# Patient Record
Sex: Male | Born: 1995 | Race: Black or African American | Hispanic: No | Marital: Married | Smoking: Former smoker
Health system: Southern US, Community
[De-identification: ages and names within clinical notes are randomized; demographics above are authoritative.]

---
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
## 2015-04-12 ENCOUNTER — Emergency Department (HOSPITAL_COMMUNITY): Payer: 59

## 2015-04-12 ENCOUNTER — Emergency Department (HOSPITAL_COMMUNITY)
Admission: EM | Admit: 2015-04-12 | Discharge: 2015-04-12 | Disposition: A | Discharge status: Home / Self Care | Payer: 59 | Attending: Emergency Medicine | Admitting: Emergency Medicine

## 2015-04-12 ENCOUNTER — Encounter (HOSPITAL_COMMUNITY): Payer: Self-pay | Admitting: *Deleted

## 2015-04-12 DIAGNOSIS — Z791 Long term (current) use of non-steroidal anti-inflammatories (NSAID): Secondary | ICD-10-CM | POA: Insufficient documentation

## 2015-04-12 DIAGNOSIS — Y92410 Unspecified street and highway as the place of occurrence of the external cause: Secondary | ICD-10-CM | POA: Diagnosis not present

## 2015-04-12 DIAGNOSIS — S8991XA Unspecified injury of right lower leg, initial encounter: Secondary | ICD-10-CM | POA: Insufficient documentation

## 2015-04-12 DIAGNOSIS — Y999 Unspecified external cause status: Secondary | ICD-10-CM | POA: Insufficient documentation

## 2015-04-12 DIAGNOSIS — Z87891 Personal history of nicotine dependence: Secondary | ICD-10-CM | POA: Diagnosis not present

## 2015-04-12 DIAGNOSIS — Y9389 Activity, other specified: Secondary | ICD-10-CM | POA: Diagnosis not present

## 2015-04-12 DIAGNOSIS — Z79899 Other long term (current) drug therapy: Secondary | ICD-10-CM | POA: Insufficient documentation

## 2015-04-12 MED ORDER — TRAMADOL HCL 50 MG PO TABS
50.0000 mg | ORAL_TABLET | Freq: Once | ORAL | Status: AC
  Administered 2015-04-12: 50 mg via ORAL
  Filled 2015-04-12: qty 1

## 2015-04-12 MED ORDER — IBUPROFEN 800 MG PO TABS
800.0000 mg | ORAL_TABLET | Freq: Once | ORAL | Status: AC
  Administered 2015-04-12: 800 mg via ORAL
  Filled 2015-04-12: qty 1

## 2015-04-12 NOTE — ED Notes (Signed)
Pt transported to xray prior to room. Transport aware to bring pt to room after completion of xrays.
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
## 2015-04-13 ENCOUNTER — Emergency Department (HOSPITAL_COMMUNITY)
Admission: EM | Admit: 2015-04-13 | Discharge: 2015-04-13 | Disposition: A | Discharge status: Home / Self Care | Payer: 59 | Attending: Emergency Medicine | Admitting: Emergency Medicine

## 2015-04-13 ENCOUNTER — Encounter (HOSPITAL_COMMUNITY): Payer: Self-pay

## 2015-04-13 DIAGNOSIS — Z791 Long term (current) use of non-steroidal anti-inflammatories (NSAID): Secondary | ICD-10-CM | POA: Diagnosis not present

## 2015-04-13 DIAGNOSIS — Z87891 Personal history of nicotine dependence: Secondary | ICD-10-CM | POA: Insufficient documentation

## 2015-04-13 DIAGNOSIS — M25561 Pain in right knee: Secondary | ICD-10-CM | POA: Insufficient documentation

## 2015-04-13 DIAGNOSIS — Z79899 Other long term (current) drug therapy: Secondary | ICD-10-CM | POA: Insufficient documentation

## 2015-04-13 MED ORDER — IBUPROFEN 400 MG PO TABS
800.0000 mg | ORAL_TABLET | Freq: Once | ORAL | Status: AC
  Administered 2015-04-13: 800 mg via ORAL
  Filled 2015-04-13: qty 2

## 2015-04-13 NOTE — Discharge Instructions (Signed)
For pain control you may take up to  of ibuprofen (that is usually 4 over the counter pills)  3 times a day (take with food) and acetaminophen  (this is 3 over the counter pills) four times a day. Do not drink alcohol or combine with other medications that have acetaminophen as an ingredient (Read the labels!).    How to Use a Knee Brace A knee brace is a device that you wear to support your knee, especially if the knee is healing after an injury or surgery. There are several types of knee braces. Some are designed to prevent an injury (prophylactic brace). These are often worn during sports. Others support an injured knee (functional brace) or keep it still while it heals (rehabilitative brace). People with severe arthritis of the knee may benefit from a brace that takes some pressure off the knee (unloader brace). Most knee braces are made from a combination of cloth and metal or plastic.  You may need to wear a knee brace to:  Relieve knee pain.  Help your knee support your weight (improve stability).  Help you walk farther (improve mobility).  Prevent injury.  Support your knee while it heals from surgery or from an injury. RISKS AND COMPLICATIONS Generally, knee braces are very safe to wear. However, problems may occur, including:  Skin irritation that may lead to infection.  Making your condition worse if you wear the brace in the wrong way. HOW TO USE A KNEE BRACE Different braces will have different instructions for use. Your health care provider will tell you or show you:  How to put on your brace.  How to adjust the brace.  When and how often to wear the brace.  How to remove the brace.  If you will need any assistive devices in addition to the brace, such as crutches or a cane. In general, your brace should:  Have the hinge of the brace line up with the bend of your knee.  Have straps, hooks, or tapes that fasten snugly around your leg.  Not feel too tight  or too loose. HOW TO CARE FOR A KNEE BRACE  Check your brace often for signs of damage, such as loose connections or attachments. Your knee brace may get damaged or wear out during normal use.  Wash the fabric parts of your brace with soap and water.  Read the insert that comes with your brace for other specific care instructions. SEEK MEDICAL CARE IF:  Your knee brace is too loose or too tight and you cannot adjust it.  Your knee brace causes skin redness, swelling, bruising, or irritation.  Your knee brace is not helping.  Your knee brace is making your knee pain worse.   This information is not intended to replace advice given to you by your health care provider. Make sure you discuss any questions you have with your health care provider.   Document Released: 05/17/2003 Document Revised: 11/15/2014 Document Reviewed: 06/19/2014 Elsevier Interactive Patient Education [employer]

## 2015-04-13 NOTE — ED Notes (Signed)
Pt A&OX4, ambulatory at d/c with steady gait, NAD 
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
## 2015-04-13 NOTE — ED Notes (Signed)
Pt reports he has all of his belongings with him at d/c 
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
## 2015-04-13 NOTE — ED Notes (Signed)
See PAs notes for secondary assessment.  

## 2015-06-07 ENCOUNTER — Emergency Department (HOSPITAL_COMMUNITY): Payer: 59

## 2015-06-07 ENCOUNTER — Emergency Department (HOSPITAL_COMMUNITY)
Admission: EM | Admit: 2015-06-07 | Discharge: 2015-06-07 | Disposition: A | Discharge status: Home / Self Care | Payer: 59 | Attending: Emergency Medicine | Admitting: Emergency Medicine

## 2015-06-07 ENCOUNTER — Encounter (HOSPITAL_COMMUNITY): Payer: Self-pay | Admitting: Emergency Medicine

## 2015-06-07 DIAGNOSIS — Z79899 Other long term (current) drug therapy: Secondary | ICD-10-CM | POA: Insufficient documentation

## 2015-06-07 DIAGNOSIS — S92512A Displaced fracture of proximal phalanx of left lesser toe(s), initial encounter for closed fracture: Secondary | ICD-10-CM | POA: Diagnosis not present

## 2015-06-07 DIAGNOSIS — S99922A Unspecified injury of left foot, initial encounter: Secondary | ICD-10-CM | POA: Diagnosis present

## 2015-06-07 DIAGNOSIS — Y998 Other external cause status: Secondary | ICD-10-CM | POA: Insufficient documentation

## 2015-06-07 DIAGNOSIS — S92912A Unspecified fracture of left toe(s), initial encounter for closed fracture: Secondary | ICD-10-CM

## 2015-06-07 DIAGNOSIS — Y92009 Unspecified place in unspecified non-institutional (private) residence as the place of occurrence of the external cause: Secondary | ICD-10-CM | POA: Diagnosis not present

## 2015-06-07 DIAGNOSIS — Y9389 Activity, other specified: Secondary | ICD-10-CM | POA: Insufficient documentation

## 2015-06-07 DIAGNOSIS — W010XXA Fall on same level from slipping, tripping and stumbling without subsequent striking against object, initial encounter: Secondary | ICD-10-CM | POA: Insufficient documentation

## 2015-06-07 MED ORDER — TRAMADOL HCL 50 MG PO TABS: 50.0000 mg | ORAL_TABLET | Freq: Two times a day (BID) | ORAL | Status: DC | PRN

## 2015-06-07 MED ORDER — TRAMADOL HCL 50 MG PO TABS
50.0000 mg | ORAL_TABLET | Freq: Once | ORAL | Status: AC
  Administered 2015-06-07: 50 mg via ORAL
  Filled 2015-06-07: qty 1

## 2015-06-07 MED ORDER — IBUPROFEN 800 MG PO TABS: 800.0000 mg | ORAL_TABLET | Freq: Three times a day (TID) | ORAL | Status: DC

## 2015-06-07 NOTE — ED Notes (Signed)
Pt. tripped and fell at home yesterday injured his left 5th toe with pain and mild swelling .

## 2015-06-07 NOTE — Discharge Instructions (Signed)
Toe Fracture °A toe fracture is a break in one of the toe bones (phalanges). °CAUSES °This condition may be caused by: °· Dropping a heavy object on your toe. °· Stubbing your toe. °· Overusing your toe or doing repetitive exercise. °· Twisting or stretching your toe out of place. °RISK FACTORS °This condition is more likely to develop in people who: °· Play contact sports. °· Have a bone disease. °· Have a low calcium level. °SYMPTOMS °The main symptoms of this condition are swelling and pain in the toe. The pain may get worse with standing or walking. Other symptoms include: °· Bruising. °· Stiffness. °· Numbness. °· A change in the way the toe looks. °· Broken bones that poke through the skin. °· Blood beneath the toenail. °DIAGNOSIS °This condition is diagnosed with a physical exam. You may also have X-rays. °TREATMENT  °Treatment for this condition depends on the type of fracture and its severity. Treatment may involve: °· Taping the broken toe to a toe that is next to it (buddy taping). This is the most common treatment for fractures in which the bone has not moved out of place (nondisplaced fracture). °· Wearing a shoe that has a wide, rigid sole to protect the toe and to limit its movement. °· Wearing a walking cast. °· Having a procedure to move the toe back into place. °· Surgery. This may be needed: °· If there are many pieces of broken bone that are out of place (displaced). °· If the toe joint breaks. °· If the bone breaks through the skin. °· Physical therapy. This is done to help regain movement and strength in the toe. °You may need follow-up X-rays to make sure that the bone is healing well and staying in position. °HOME CARE INSTRUCTIONS °If You Have a Cast: °· Do not stick anything inside the cast to scratch your skin. Doing that increases your risk of infection. °· Check the skin around the cast every day. Report any concerns to your health care provider. You may put lotion on dry skin around the  edges of the cast. Do not apply lotion to the skin underneath the cast. °· Do not put pressure on any part of the cast until it is fully hardened. This may take several hours. °· Keep the cast clean and dry. °Bathing °· Do not take baths, swim, or use a hot tub until your health care provider approves. Ask your health care provider if you can take showers. You may only be allowed to take sponge baths for bathing. °· If your health care provider approves bathing and showering, cover the cast or bandage (dressing) with a watertight plastic bag to protect it from water. Do not let the cast or dressing get wet. °Managing Pain, Stiffness, and Swelling °· If you do not have a cast, apply ice to the injured area, if directed. °¨ Put ice in a plastic bag. °¨ Place a towel between your skin and the bag. °¨ Leave the ice on for 20 minutes, 2-3 times per day. °· Move your toes often to avoid stiffness and to lessen swelling. °· Raise (elevate) the injured area above the level of your heart while you are sitting or lying down. °Driving °· Do not drive or operate heavy machinery while taking pain medicine. °· Do not drive while wearing a cast on a foot that you use for driving. °Activity °· Return to your normal activities as directed by your health care provider. Ask your health care   provider what activities are safe for you. °· Perform exercises daily as directed by your health care provider or physical therapist. °Safety °· Do not use the injured limb to support your body weight until your health care provider says that you can. Use crutches or other assistive devices as directed by your health care provider. °General Instructions °· If your toe was treated with buddy taping, follow your health care provider's instructions for changing the gauze and tape. Change it more often: °¨ The gauze and tape get wet. If this happens, dry the space between the toes. °¨ The gauze and tape are too tight and cause your toe to become pale  or numb. °· Wear a protective shoe as directed by your health care provider. If you were not given a protective shoe, wear sturdy, supportive shoes. Your shoes should not pinch your toes and should not fit tightly against your toes. °· Do not use any tobacco products, including cigarettes, chewing tobacco, or e-cigarettes. Tobacco can delay bone healing. If you need help quitting, ask your health care provider. °· Take medicines only as directed by your health care provider. °· Keep all follow-up visits as directed by your health care provider. This is important. °SEEK MEDICAL CARE IF: °· You have a fever. °· Your pain medicine is not helping. °· Your toe is cold. °· Your toe is numb. °· You still have pain after one week of rest and treatment. °· You still have pain after your health care provider has said that you can start walking again. °· You have pain, tingling, or numbness in your foot that is not going away. °SEEK IMMEDIATE MEDICAL CARE IF: °· You have severe pain. °· You have redness or inflammation in your toe that is getting worse. °· You have pain or numbness in your toe that is getting worse. °· Your toe turns blue. °  °This information is not intended to replace advice given to you by your health care provider. Make sure you discuss any questions you have with your health care provider. °  °Document Released: 02/22/2000 Document Revised: 11/15/2014 Document Reviewed: 12/21/2013 °Elsevier Interactive Patient Education ©2016 [employer] °RICE for Routine Care of Injuries °The routine care of many injuries includes rest, ice, compression, and elevation (RICE therapy). RICE therapy is often recommended for injuries to soft tissues, such as a muscle strain, ligament injuries, bruises, and overuse injuries. It can also be used for some bony injuries. Using RICE therapy can help to relieve pain, lessen swelling, and enable your body to heal. °Rest °Rest is required to allow your body to heal. This usually  involves reducing your normal activities and avoiding use of the injured part of your body. Generally, you can return to your normal activities when you are comfortable and have been given permission by your health care provider. °Ice °Icing your injury helps to keep the swelling down, and it lessens pain. Do not apply ice directly to your skin. °· Put ice in a plastic bag. °· Place a towel between your skin and the bag. °· Leave the ice on for 20 minutes, 2-3 times a day. °Do this for as long as you are directed by your health care provider. °Compression °Compression means putting pressure on the injured area. Compression helps to keep swelling down, gives support, and helps with discomfort. Compression may be done with an elastic bandage. If an elastic bandage has been applied, follow these general tips: °· Remove and reapply the bandage every 3-4 hours or as directed by   your health care provider. °· Make sure the bandage is not wrapped too tightly, because this can cut off circulation. If part of your body beyond the bandage becomes blue, numb, cold, swollen, or more painful, your bandage is most likely too tight. If this occurs, remove your bandage and reapply it more loosely. °· See your health care provider if the bandage seems to be making your problems worse rather than better. °Elevation °Elevation means keeping the injured area raised. This helps to lessen swelling and decrease pain. If possible, your injured area should be elevated at or above the level of your heart or the center of your chest. °WHEN SHOULD I SEEK MEDICAL CARE? °You should seek medical care if: °· Your pain and swelling continue. °· Your symptoms are getting worse rather than improving. °These symptoms may indicate that further evaluation or further X-rays are needed. Sometimes, X-rays may not show a small broken bone (fracture) until a number of days later. Make a follow-up appointment with your health care provider. °WHEN SHOULD I SEEK  IMMEDIATE MEDICAL CARE? °You should seek immediate medical care if: °· You have sudden severe pain at or below the area of your injury. °· You have redness or increased swelling around your injury. °· You have tingling or numbness at or below the area of your injury that does not improve after you remove the elastic bandage. °  °This information is not intended to replace advice given to you by your health care provider. Make sure you discuss any questions you have with your health care provider. °  °Document Released: 06/08/2000 Document Revised: 11/15/2014 Document Reviewed: 02/01/2014 °Elsevier Interactive Patient Education ©2016 [employer] ° ° °

## 2015-06-07 NOTE — ED Provider Notes (Signed)
CSN: [id number]     Arrival date & time 06/07/15  0405 History   First MD Initiated Contact with Patient 06/07/15 [phone]     Chief Complaint  Patient presents with  . Toe Pain     (Consider location/radiation/quality/duration/timing/severity/associated sxs/prior Treatment) HPI   Pt is a 20 y/o male, presents to the emergency room for evaluation of left pinky toe pain secondary to an injury that occurred yesterday.  Pt states he tripped over something in his hall at home.  He has swelling, bruising and throbbing pain rated 8/10.  It is worse with movement and palpation.  Pain is minimally better with ice an elevation.  He denies numbness.  No other injury or complaints.     History reviewed. No pertinent past medical history. History reviewed. No pertinent past surgical history. No family history on file. Social History  Substance Use Topics  . Smoking status: Former Games developermoker  . Smokeless tobacco: None  . Alcohol Use: No    Review of Systems  All other systems reviewed and are negative.     Allergies  Review of patient's allergies indicates no known allergies.  Home Medications   Prior to Admission medications   Medication Sig Start Date End Date Taking? Authorizing Provider  ibuprofen (ADVIL,MOTRIN) 800 MG tablet Take 1 tablet (800 mg total) by mouth 3 (three) times daily. 06/07/15   Danelle BerryLeisa Georgeann Brinkman, PA-C  methocarbamol (ROBAXIN) 500 MG tablet Take 1 tablet (500 mg total) by mouth 2 (two) times daily. 09/02/14   Elson AreasLeslie K Sofia, PA-C  traMADol (ULTRAM) 50 MG tablet Take 1 tablet (50 mg total) by mouth every 12 (twelve) hours as needed for severe pain. 06/07/15   Danelle BerryLeisa Tressy Kunzman, PA-C   BP 137/68 mmHg  Pulse 82  Temp(Src) 98 F (36.7 C) (Oral)  Resp 16  Ht 6\' 1"  (1.854 m)  Wt 86.183 kg  BMI 25.07 kg/m2  SpO2 98% Physical Exam  Constitutional: He is oriented to person, place, and time. He appears well-developed and well-nourished. No distress.  HENT:  Head: Normocephalic and  atraumatic.  Right Ear: External ear normal.  Left Ear: External ear normal.  Nose: Nose normal.  Mouth/Throat: Oropharynx is clear and moist. No oropharyngeal exudate.  Eyes: Conjunctivae and EOM are normal. Pupils are equal, round, and reactive to light. Right eye exhibits no discharge. Left eye exhibits no discharge. No scleral icterus.  Neck: Normal range of motion. Neck supple. No JVD present. No tracheal deviation present.  Cardiovascular: Normal rate and regular rhythm.   Pulmonary/Chest: Effort normal and breath sounds normal. No stridor. No respiratory distress.  Musculoskeletal: Normal range of motion. He exhibits no edema.  Pt able to move all toes on left foot, normal sensation 2+ DP pulse bilaterally  Lymphadenopathy:    He has no cervical adenopathy.  Neurological: He is alert and oriented to person, place, and time. He exhibits normal muscle tone. Coordination normal.  Skin: Skin is warm and dry. No rash noted. He is not diaphoretic. There is erythema. No pallor.  Erythema to left 5th toe, minimal swelling, toe nail normal in appearance.    Psychiatric: He has a normal mood and affect. His behavior is normal. Judgment and thought content normal.  Nursing note and vitals reviewed.   ED Course  Procedures (including critical care time) Labs Review Labs Reviewed - No data to display  Imaging Review Dg Toe 5th Left  06/07/2015  CLINICAL DATA:  Status post fall on steps, with injury to left  fifth toe and associated pain. Initial encounter. EXAM: DG TOE 5TH LEFT COMPARISON:  None. FINDINGS: There is a mildly displaced fracture through the medial distal aspect of the fifth proximal phalanx, extending to the interphalangeal joint. Mild medial and proximal displacement is noted. Surrounding soft tissue swelling is noted. Visualized joint spaces are otherwise preserved. IMPRESSION: Mildly displaced fracture through the medial distal aspect of the fifth proximal phalanx, extending to  the interphalangeal joint. Mild medial and proximal displacement noted. Electronically Signed   By: Roanna Raider M.D.   On: 06/07/2015 04:39   I have personally reviewed and evaluated these images and lab results as part of my medical decision-making.   EKG Interpretation None      MDM   Pt with left 5th toe fx, no dislocation, no nailbed involvement.   RICE tx encouraged.  Pt was buddy taped, placed in post-op shoe and given crutches and pain medication.  D/C in good condition.  Final diagnoses:  Toe fracture, left, closed, initial encounter        Danelle Berry, PA-C 06/11/15 0428  Tilden Fossa, MD 06/20/15 [phone]

## 2015-06-17 ENCOUNTER — Emergency Department (HOSPITAL_COMMUNITY): Payer: 59

## 2015-06-17 ENCOUNTER — Encounter (HOSPITAL_COMMUNITY): Payer: Self-pay | Admitting: *Deleted

## 2015-06-17 ENCOUNTER — Emergency Department (HOSPITAL_COMMUNITY)
Admission: EM | Admit: 2015-06-17 | Discharge: 2015-06-18 | Disposition: A | Discharge status: Home / Self Care | Payer: 59 | Attending: Emergency Medicine | Admitting: Emergency Medicine

## 2015-06-17 DIAGNOSIS — Z792 Long term (current) use of antibiotics: Secondary | ICD-10-CM | POA: Insufficient documentation

## 2015-06-17 DIAGNOSIS — Y998 Other external cause status: Secondary | ICD-10-CM | POA: Insufficient documentation

## 2015-06-17 DIAGNOSIS — Y9289 Other specified places as the place of occurrence of the external cause: Secondary | ICD-10-CM | POA: Insufficient documentation

## 2015-06-17 DIAGNOSIS — S62314A Displaced fracture of base of fourth metacarpal bone, right hand, initial encounter for closed fracture: Secondary | ICD-10-CM | POA: Insufficient documentation

## 2015-06-17 DIAGNOSIS — Y9389 Activity, other specified: Secondary | ICD-10-CM | POA: Insufficient documentation

## 2015-06-17 DIAGNOSIS — Z87891 Personal history of nicotine dependence: Secondary | ICD-10-CM | POA: Insufficient documentation

## 2015-06-17 DIAGNOSIS — S62308A Unspecified fracture of other metacarpal bone, initial encounter for closed fracture: Secondary | ICD-10-CM

## 2015-06-17 DIAGNOSIS — W228XXA Striking against or struck by other objects, initial encounter: Secondary | ICD-10-CM | POA: Diagnosis not present

## 2015-06-17 DIAGNOSIS — Z79899 Other long term (current) drug therapy: Secondary | ICD-10-CM | POA: Insufficient documentation

## 2015-06-17 DIAGNOSIS — S6991XA Unspecified injury of right wrist, hand and finger(s), initial encounter: Secondary | ICD-10-CM | POA: Diagnosis present

## 2015-06-17 NOTE — ED Notes (Signed)
Patient transported to X-ray 
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
## 2015-06-18 MED ORDER — NAPROXEN 500 MG PO TABS: 500.0000 mg | ORAL_TABLET | Freq: Two times a day (BID) | ORAL | Status: DC

## 2015-06-18 MED ORDER — HYDROCODONE-ACETAMINOPHEN 5-325 MG PO TABS: 1.0000 | ORAL_TABLET | Freq: Four times a day (QID) | ORAL | Status: DC | PRN

## 2015-06-18 NOTE — ED Notes (Signed)
EDP at bedside  

## 2015-06-18 NOTE — ED Provider Notes (Signed)
CSN: [id number]     Arrival date & time 06/17/15  2317 History   First MD Initiated Contact with Patient 06/17/15 2350     Chief Complaint  Patient presents with  . Hand Injury     (Consider location/radiation/quality/duration/timing/severity/associated sxs/prior Treatment) HPI Comments: Patient presents today with right hand pain.  Pain has been present since punching a wall last evening.  Pain located over the 4th and 5th metacarpals.  He has associated swelling to the dorsal aspect of the hand.  Pain is worse with palpation and moving his fingers.  He has been taking 800 mg Ibuprofen for the pain without relief.  He denies numbness or tingling of his fingers.  Denies wrist pain.  Denies any other injuries.  Patient is a 20 y.o. male presenting with hand injury. The history is provided by the patient.  Hand Injury   History reviewed. No pertinent past medical history. History reviewed. No pertinent past surgical history. No family history on file. Social History  Substance Use Topics  . Smoking status: Former Games developermoker  . Smokeless tobacco: None  . Alcohol Use: No    Review of Systems  All other systems reviewed and are negative.     Allergies  Review of patient's allergies indicates no known allergies.  Home Medications   Prior to Admission medications   Medication Sig Start Date End Date Taking? Authorizing Provider  ibuprofen (ADVIL,MOTRIN) 800 MG tablet Take 1 tablet (800 mg total) by mouth 3 (three) times daily. 06/07/15   Danelle BerryLeisa Tapia, PA-C  methocarbamol (ROBAXIN) 500 MG tablet Take 1 tablet (500 mg total) by mouth 2 (two) times daily. 09/02/14   Elson AreasLeslie K Sofia, PA-C  traMADol (ULTRAM) 50 MG tablet Take 1 tablet (50 mg total) by mouth every 12 (twelve) hours as needed for severe pain. 06/07/15   Danelle BerryLeisa Tapia, PA-C   BP 142/86 mmHg  Pulse 72  Temp(Src) 98.3 F (36.8 C) (Oral)  Resp 20  Ht 6\' 1"  (1.854 m)  Wt 86.637 kg  BMI 25.20 kg/m2  SpO2 99% Physical Exam   Constitutional: He appears well-developed and well-nourished.  HENT:  Head: Normocephalic and atraumatic.  Neck: Normal range of motion. Neck supple.  Cardiovascular: Normal rate, regular rhythm and normal heart sounds.   Pulses:      Radial pulses are 2+ on the right side.  Pulmonary/Chest: Effort normal and breath sounds normal.  Musculoskeletal: Normal range of motion.  Tenderness to palpation of the 4th and 5th metacarpals of the right hand.  Mild diffuse swelling of the dorsal aspect of the hand.  Full ROM of the right wrist.  Skin intact.    Neurological: He is alert.  Distal sensation of the right hand intact.  Skin: Skin is warm and dry.  Psychiatric: He has a normal mood and affect.  Nursing note and vitals reviewed.   ED Course  Procedures (including critical care time) Labs Review Labs Reviewed - No data to display  Imaging Review Dg Hand Complete Right  06/17/2015  CLINICAL DATA:  Right hand pain after striking wall last cbe. EXAM: RIGHT HAND - COMPLETE 3+ VIEW COMPARISON:  None. FINDINGS: There is a nondisplaced well aligned fracture at the base of the fourth metacarpal, probably sparing the articular surface. No dislocation. No radiopaque foreign body. IMPRESSION: Fourth metacarpal base fracture. Electronically Signed   By: Ellery Plunkaniel R Mitchell M.D.   On: 06/17/2015 23:41   I have personally reviewed and evaluated these images and lab results as part  of my medical decision-making.   EKG Interpretation None      MDM   Final diagnoses:  None   Patient presents today with right hand pain after punching a wall last evening.  Xray showing non displaced well aligned fracture of the base of the 4th metacarpal.  Fracture is closed.  Patient neurovascularly intact.  Patient given Ulnar Gutter splint and follow up with Hand Surgery.  Stable for discharge.  Return precautions given.    Santiago Glad, PA-C 06/18/15 1610  Gilda Crease, MD 06/18/15 [phone]

## 2015-06-18 NOTE — ED Notes (Signed)
Ortho-tech has been paged.  

## 2015-06-18 NOTE — ED Notes (Signed)
Ortho at bedside.

## 2015-06-18 NOTE — Discharge Instructions (Signed)
Take Norco as needed for severe pain.  Do not drive or operate heavy machinery for 4-6 hours after taking medication.

## 2015-06-18 NOTE — Progress Notes (Signed)
Orthopedic Tech Progress Note Patient Details:  Shawn Downs 11/10/1995 [id number]  Ortho Devices Type of Ortho Device: Ulna gutter splint Ortho Device/Splint Location: rue Ortho Device/Splint Interventions: Ordered, Application   [name], [name] 06/18/2015, 1:09 AM

## 2016-02-24 ENCOUNTER — Encounter (HOSPITAL_COMMUNITY): Payer: Self-pay | Admitting: Emergency Medicine

## 2016-02-24 ENCOUNTER — Emergency Department (HOSPITAL_COMMUNITY)
Admission: EM | Admit: 2016-02-24 | Discharge: 2016-02-24 | Disposition: A | Discharge status: Home / Self Care | Payer: 59 | Attending: Emergency Medicine | Admitting: Emergency Medicine

## 2016-02-24 DIAGNOSIS — S61112A Laceration without foreign body of left thumb with damage to nail, initial encounter: Secondary | ICD-10-CM | POA: Diagnosis present

## 2016-02-24 DIAGNOSIS — Y929 Unspecified place or not applicable: Secondary | ICD-10-CM | POA: Insufficient documentation

## 2016-02-24 DIAGNOSIS — W260XXA Contact with knife, initial encounter: Secondary | ICD-10-CM | POA: Diagnosis not present

## 2016-02-24 DIAGNOSIS — Z87891 Personal history of nicotine dependence: Secondary | ICD-10-CM | POA: Diagnosis not present

## 2016-02-24 DIAGNOSIS — Y9389 Activity, other specified: Secondary | ICD-10-CM | POA: Insufficient documentation

## 2016-02-24 DIAGNOSIS — Y999 Unspecified external cause status: Secondary | ICD-10-CM | POA: Diagnosis not present

## 2016-02-24 MED ORDER — TETANUS-DIPHTH-ACELL PERTUSSIS 5-2.5-18.5 LF-MCG/0.5 IM SUSP
0.5000 mL | Freq: Once | INTRAMUSCULAR | Status: AC
  Administered 2016-02-24: 0.5 mL via INTRAMUSCULAR
  Filled 2016-02-24: qty 0.5

## 2016-02-24 MED ORDER — ACETAMINOPHEN 325 MG PO TABS
650.0000 mg | ORAL_TABLET | Freq: Once | ORAL | Status: AC
  Administered 2016-02-24: 650 mg via ORAL
  Filled 2016-02-24: qty 2

## 2016-02-24 MED ORDER — LIDOCAINE-EPINEPHRINE (PF) 2 %-1:200000 IJ SOLN
10.0000 mL | Freq: Once | INTRAMUSCULAR | Status: DC
  Filled 2016-02-24: qty 20

## 2016-02-24 MED ORDER — TRAMADOL HCL 50 MG PO TABS
50.0000 mg | ORAL_TABLET | Freq: Once | ORAL | Status: AC
  Administered 2016-02-24: 50 mg via ORAL
  Filled 2016-02-24: qty 1

## 2016-02-24 MED ORDER — LIDOCAINE HCL (PF) 1 % IJ SOLN
30.0000 mL | Freq: Once | INTRAMUSCULAR | Status: AC
  Administered 2016-02-24: 30 mL
  Filled 2016-02-24: qty 30

## 2016-02-24 NOTE — Discharge Instructions (Signed)
Read the information below.  Your tetanus was updated. Your wound was cleaned and 4 stitches were placed. Keep dressing on for 24 hours, following, wash with warm soap and water, apply antibiotic ointment and a new dressing. You can take tylenol or motrin for pain relief.  Look for signs of infection - redness, swelling, warmth, streaking, purulent drainage - if any signs return to ED immediately.  Return to ED or follow up with PCP in 3 days for wound recheck. Stitches will need to be removed in 10-14 days.  Use the prescribed medication as directed.  Please discuss all new medications with your pharmacist.   Contact information in your paperwork is provided for establishing a primary doctor.  You may return to the Emergency Department at any time for worsening condition or any new symptoms that concern you.

## 2016-02-24 NOTE — ED Provider Notes (Signed)
[facility]-EMERGENCY DEPT Provider Note   CSN: [id number] Arrival date & time: 02/24/16  1707     History   Chief Complaint Chief Complaint  Patient presents with  . Extremity Laceration    HPI Shawn Downs is a 20 y.o. male.  Shawn Downs is a 20 y.o. male presents to ED with complaint of laceration to left thumb sustained at approximately 3:30pm. Patient reports he was de-boning chicken when the knife slipped, cutting his left thumb and nail. He applied pressure and raised his hand above his head PTA. Bleeding is controlled. Unknown tetanus status. Not on anti-coagulation therapy. No h/o immunocompromising conditions. Denies fever, numbness, or weakness.       History reviewed. No pertinent past medical history.  There are no active problems to display for this patient.   History reviewed. No pertinent surgical history.     Home Medications    Prior to Admission medications   Medication Sig Start Date End Date Taking? Authorizing Provider  HYDROcodone-acetaminophen (NORCO/VICODIN) 5-325 MG tablet Take 1-2 tablets by mouth every 6 (six) hours as needed. 06/18/15   Heather Laisure, PA-C  ibuprofen (ADVIL,MOTRIN) 800 MG tablet Take 1 tablet (800 mg total) by mouth 3 (three) times daily. 06/07/15   Danelle BerryLeisa Tapia, PA-C  methocarbamol (ROBAXIN) 500 MG tablet Take 1 tablet (500 mg total) by mouth 2 (two) times daily. 09/02/14   Elson AreasLeslie K Sofia, PA-C  naproxen (NAPROSYN) 500 MG tablet Take 1 tablet (500 mg total) by mouth 2 (two) times daily. 06/18/15   Heather Laisure, PA-C  traMADol (ULTRAM) 50 MG tablet Take 1 tablet (50 mg total) by mouth every 12 (twelve) hours as needed for severe pain. 06/07/15   Danelle BerryLeisa Tapia, PA-C    Family History History reviewed. No pertinent family history.  Social History Social History  Substance Use Topics  . Smoking status: Former Games developermoker  . Smokeless tobacco: Not on file  . Alcohol use No     Allergies   Patient has no known  allergies.   Review of Systems Review of Systems  Constitutional: Negative for fever.  Skin: Positive for wound.  Allergic/Immunologic: Negative for immunocompromised state.  Neurological: Negative for weakness and numbness.     Physical Exam Updated Vital Signs BP 137/80 (BP Location: Right Arm)   Pulse 73   Temp 98.2 F (36.8 C) (Oral)   Resp 18   SpO2 97%   Physical Exam  Constitutional: He appears well-developed and well-nourished. No distress.  HENT:  Head: Normocephalic and atraumatic.  Eyes: Conjunctivae are normal. No scleral icterus.  Neck: Normal range of motion.  Pulmonary/Chest: Effort normal. No respiratory distress.  Abdominal: He exhibits no distension.  Musculoskeletal:  2cm laceration to left thumb and nail. Minimal appreciable blood noted under nail. Bleeding is controlled. Sensation intact. ROM intact. Capillary refill intact.   Neurological: He is alert.  Skin: Skin is warm and dry. Laceration noted. He is not diaphoretic.  Psychiatric: He has a normal mood and affect. His behavior is normal.     ED Treatments / Results  Labs (all labs ordered are listed, but only abnormal results are displayed) Labs Reviewed - No data to display  EKG  EKG Interpretation None       Radiology No results found.  Procedures Procedures (including critical care time) LACERATION REPAIR Performed by: Lona KettleAshley Laurel Deanza Upperman Authorized by: Lona KettleAshley Laurel Blanche Gallien Consent: Verbal consent obtained. Risks and benefits: risks, benefits and alternatives were discussed Consent given by: patient Patient identity  confirmed: provided demographic data Prepped and Draped in normal sterile fashion Wound explored  Laceration Location: left thumb  Laceration Length: 2cm  No Foreign Bodies seen or palpated  Anesthesia: local infiltration  Anesthetic: lidocaine 2% w/o epinephrine - digital block  Anesthetic total: 7 ml  Irrigation method: syringe Amount of cleaning:  standard  Skin closure: dermal  Number of sutures: 4  Technique: simple interrupted  Patient tolerance: Patient tolerated the procedure well with no immediate complications.  Medications Ordered in ED Medications  lidocaine-EPINEPHrine (XYLOCAINE W/EPI) 2 %-1:200000 (PF) injection 10 mL (10 mLs Infiltration Not Given 02/24/16 1857)  acetaminophen (TYLENOL) tablet 650 mg (650 mg Oral Given 02/24/16 1850)  Tdap (BOOSTRIX) injection 0.5 mL (0.5 mLs Intramuscular Given 02/24/16 1850)  lidocaine (PF) (XYLOCAINE) 1 % injection 30 mL (30 mLs Infiltration Given 02/24/16 1936)  traMADol (ULTRAM) tablet 50 mg (50 mg Oral Given 02/24/16 2041)     Initial Impression / Assessment and Plan / ED Course  I have reviewed the triage vital signs and the nursing notes.  Pertinent labs & imaging results that were available during my care of the patient were reviewed by me and considered in my medical decision making (see chart for details).  Clinical Course     Patient presents to ED with laceration to left thumb sustained PTA. Patient is afebrile and non-toxic appearing in NAD. VSS. 2cm laceration to left thumb and nail. Bleeding controlled. ROM and sensation intact. Capillary refill intact.   Wound irrigated and cleaned by myself. Wound explored and base of wound visualized in a bloodless field without evidence of foreign body.  Laceration occurred < 8 hours prior to repair which was well tolerated. Tdap updated.  Pt has no comorbidities to effect normal wound healing. Pt discharged without antibiotics.  Discussed suture home care with patient and answered questions. Pt to follow-up for wound check in 2-3 days and suture removal in 10-14 days; they are to return to the ED sooner for signs of infection. Pt is hemodynamically stable with no complaints prior to dc. Pt voiced understanding and is agreeable.   Discussed pt with Dr. Dalene SeltzerSchlossman who also evaluated patient, agrees with plan.    Final Clinical  Impressions(s) / ED Diagnoses   Final diagnoses:  Laceration of left thumb without foreign body with damage to nail, initial encounter    New Prescriptions Discharge Medication List as of 02/24/2016  8:36 PM       Lona KettleAshley Laurel Cortney Beissel, PA-C 02/24/16 2123    Alvira MondayErin Schlossman, MD 02/25/16 1212

## 2016-02-24 NOTE — ED Triage Notes (Signed)
Pt here for laceration to left thumb with knife; bleeding controlled

## 2016-02-27 ENCOUNTER — Encounter (HOSPITAL_COMMUNITY): Payer: Self-pay | Admitting: Vascular Surgery

## 2016-02-27 ENCOUNTER — Emergency Department (HOSPITAL_COMMUNITY)
Admission: EM | Admit: 2016-02-27 | Discharge: 2016-02-27 | Disposition: A | Discharge status: Home / Self Care | Payer: 59 | Attending: Emergency Medicine | Admitting: Emergency Medicine

## 2016-02-27 DIAGNOSIS — Z87891 Personal history of nicotine dependence: Secondary | ICD-10-CM | POA: Insufficient documentation

## 2016-02-27 DIAGNOSIS — Z4802 Encounter for removal of sutures: Secondary | ICD-10-CM | POA: Diagnosis present

## 2016-02-27 DIAGNOSIS — Z5189 Encounter for other specified aftercare: Secondary | ICD-10-CM

## 2016-02-27 NOTE — ED Provider Notes (Signed)
[facility]-EMERGENCY DEPT Provider Note   CSN: [id number] Arrival date & time: 02/27/16  1610  By signing my name below, I, Rosario AdieWilliam Andrew Hiatt, attest that this documentation has been prepared under the direction and in the presence of Rolland PorterMark Margalit Leece, MD. Electronically Signed: Rosario AdieWilliam Andrew Hiatt, ED Scribe. 02/27/16. 4:56 PM.  History   Chief Complaint Chief Complaint  Patient presents with  . Suture / Staple Removal   The history is provided by the patient and medical records. No language interpreter was used.    HPI Comments: Shawn Downs is a 20 y.o. male with no pertinent PMHx, who presents to the Emergency Department for a wound check of the left first digit s/p laceration repair which was performed on 02/24/16 (~3 days ago). Prior to his repair, pt sustained his wound via the sharp end of a metal knife coming across the digit. While in the ED, his laceration was repaired with 4 sutures w/o immediate complication and the pt tolerated the procedure well. Since his repair, he states that the wound has been well healing, and without significant color change, redness, or drainage to the area. He denies weakness of the area, or any other associated symptoms.   History reviewed. No pertinent past medical history.  There are no active problems to display for this patient.  History reviewed. No pertinent surgical history.  Home Medications    Prior to Admission medications   Medication Sig Start Date End Date Taking? Authorizing Provider  HYDROcodone-acetaminophen (NORCO/VICODIN) 5-325 MG tablet Take 1-2 tablets by mouth every 6 (six) hours as needed. 06/18/15   Heather Laisure, PA-C  ibuprofen (ADVIL,MOTRIN) 800 MG tablet Take 1 tablet (800 mg total) by mouth 3 (three) times daily. 06/07/15   Danelle BerryLeisa Tapia, PA-C  methocarbamol (ROBAXIN) 500 MG tablet Take 1 tablet (500 mg total) by mouth 2 (two) times daily. 09/02/14   Elson AreasLeslie K Sofia, PA-C  naproxen (NAPROSYN) 500 MG tablet Take 1 tablet (500  mg total) by mouth 2 (two) times daily. 06/18/15   Heather Laisure, PA-C  traMADol (ULTRAM) 50 MG tablet Take 1 tablet (50 mg total) by mouth every 12 (twelve) hours as needed for severe pain. 06/07/15   Danelle BerryLeisa Tapia, PA-C   Family History No family history on file.  Social History Social History  Substance Use Topics  . Smoking status: Former Games developermoker  . Smokeless tobacco: Never Used  . Alcohol use No   Allergies   Patient has no known allergies.  Review of Systems Review of Systems  Constitutional: Negative for appetite change, chills, diaphoresis, fatigue and fever.  HENT: Negative for mouth sores, sore throat and trouble swallowing.   Eyes: Negative for visual disturbance.  Respiratory: Negative for cough, chest tightness, shortness of breath and wheezing.   Cardiovascular: Negative for chest pain.  Gastrointestinal: Negative for abdominal distention, abdominal pain, diarrhea, nausea and vomiting.  Endocrine: Negative for polydipsia, polyphagia and polyuria.  Genitourinary: Negative for dysuria, frequency and hematuria.  Musculoskeletal: Negative for gait problem.  Skin: Positive for wound. Negative for color change, pallor and rash.  Neurological: Negative for dizziness, syncope, weakness, light-headedness and headaches.  Hematological: Does not bruise/bleed easily.  Psychiatric/Behavioral: Negative for behavioral problems and confusion.   Physical Exam Updated Vital Signs BP 127/76 (BP Location: Left Arm)   Pulse 67   Temp 98.1 F (36.7 C) (Oral)   Resp 18   SpO2 98%   Physical Exam  Constitutional: He is oriented to person, place, and time. He appears well-developed  and well-nourished. No distress.  HENT:  Head: Normocephalic.  Eyes: Conjunctivae are normal. Pupils are equal, round, and reactive to light. No scleral icterus.  Neck: Normal range of motion. Neck supple. No thyromegaly present.  Cardiovascular: Normal rate and regular rhythm.  Exam reveals no gallop and  no friction rub.   No murmur heard. Pulmonary/Chest: Effort normal and breath sounds normal. No respiratory distress. He has no wheezes. He has no rales.  Abdominal: Soft. Bowel sounds are normal. He exhibits no distension. There is no tenderness. There is no rebound.  Musculoskeletal: Normal range of motion.  Left thumb: Distal based flap laceration well approximated with excelled distal capillary refill  Neurological: He is alert and oriented to person, place, and time.  Skin: Skin is warm and dry. No rash noted.  Psychiatric: He has a normal mood and affect. His behavior is normal.   ED Treatments / Results  DIAGNOSTIC STUDIES: Oxygen Saturation is 98% on RA, normal by my interpretation.   COORDINATION OF CARE: 4:53 PM-Discussed next steps with pt. Pt verbalized understanding and is agreeable with the plan.   Procedures Procedures   Medications Ordered in ED Medications - No data to display  Initial Impression / Assessment and Plan / ED Course  I have reviewed the triage vital signs and the nursing notes.  Pertinent labs & imaging results that were available during my care of the patient were reviewed by me and considered in my medical decision making (see chart for details).  Clinical Course      Final Clinical Impressions(s) / ED Diagnoses   Final diagnoses:  Visit for wound check   New Prescriptions New Prescriptions   No medications on file   Wound appearing well. No sign of infection. No sign of compromised blood flow. Plan additional recheck and suture removal in 7 days.   Rolland PorterMark Panda Crossin, MD 02/27/16 [phone]

## 2016-02-27 NOTE — ED Triage Notes (Signed)
Pt reports to the ED for eval of suture removal and evaluation. Pt reports he cut his finger on Sunday and he had stitches placed and was told to f/u here after 2-3 days to have the stitches checked. Denies any drainage from the wound, erythema, swelling, or increased pain to the site. Site well approximated, no evidence of wound dehiscence

## 2016-02-27 NOTE — Discharge Instructions (Signed)
Suture removal in 7 days

## 2016-05-15 ENCOUNTER — Emergency Department (HOSPITAL_COMMUNITY)
Admission: EM | Admit: 2016-05-15 | Discharge: 2016-05-15 | Disposition: A | Discharge status: Home / Self Care | Payer: 59 | Attending: Emergency Medicine | Admitting: Emergency Medicine

## 2016-05-15 ENCOUNTER — Encounter (HOSPITAL_COMMUNITY): Payer: Self-pay

## 2016-05-15 ENCOUNTER — Emergency Department (HOSPITAL_COMMUNITY): Payer: 59

## 2016-05-15 DIAGNOSIS — Z79899 Other long term (current) drug therapy: Secondary | ICD-10-CM | POA: Insufficient documentation

## 2016-05-15 DIAGNOSIS — H6693 Otitis media, unspecified, bilateral: Secondary | ICD-10-CM | POA: Insufficient documentation

## 2016-05-15 DIAGNOSIS — Z87891 Personal history of nicotine dependence: Secondary | ICD-10-CM | POA: Insufficient documentation

## 2016-05-15 DIAGNOSIS — J069 Acute upper respiratory infection, unspecified: Secondary | ICD-10-CM | POA: Insufficient documentation

## 2016-05-15 DIAGNOSIS — R05 Cough: Secondary | ICD-10-CM | POA: Diagnosis present

## 2016-05-15 MED ORDER — IBUPROFEN 800 MG PO TABS
800.0000 mg | ORAL_TABLET | Freq: Once | ORAL | Status: AC
  Administered 2016-05-15: 800 mg via ORAL
  Filled 2016-05-15: qty 1

## 2016-05-15 MED ORDER — ALBUTEROL SULFATE (2.5 MG/3ML) 0.083% IN NEBU
INHALATION_SOLUTION | RESPIRATORY_TRACT | Status: AC
  Filled 2016-05-15: qty 3

## 2016-05-15 MED ORDER — HYDROCOD POLST-CPM POLST ER 10-8 MG/5ML PO SUER
5.0000 mL | Freq: Once | ORAL | Status: AC
  Administered 2016-05-15: 5 mL via ORAL
  Filled 2016-05-15: qty 5

## 2016-05-15 MED ORDER — AMOXICILLIN 500 MG PO CAPS
1000.0000 mg | ORAL_CAPSULE | Freq: Once | ORAL | Status: AC
  Administered 2016-05-15: 1000 mg via ORAL
  Filled 2016-05-15: qty 2

## 2016-05-15 MED ORDER — ALBUTEROL SULFATE (2.5 MG/3ML) 0.083% IN NEBU
5.0000 mg | INHALATION_SOLUTION | Freq: Once | RESPIRATORY_TRACT | Status: AC
  Administered 2016-05-15: 5 mg via RESPIRATORY_TRACT

## 2016-05-15 MED ORDER — AMOXICILLIN 500 MG PO CAPS: 1000.0000 mg | ORAL_CAPSULE | Freq: Two times a day (BID) | ORAL | 0 refills | Status: DC

## 2016-05-15 MED ORDER — ACETAMINOPHEN 500 MG PO TABS
1000.0000 mg | ORAL_TABLET | Freq: Once | ORAL | Status: AC
  Administered 2016-05-15: 1000 mg via ORAL
  Filled 2016-05-15: qty 2

## 2016-05-15 MED ORDER — BENZONATATE 100 MG PO CAPS: 100.0000 mg | ORAL_CAPSULE | Freq: Three times a day (TID) | ORAL | 0 refills | Status: DC

## 2016-05-15 NOTE — ED Provider Notes (Signed)
[facility]-EMERGENCY DEPT Provider Note   CSN: [id number] Arrival date & time: 05/15/16  4098     History   Chief Complaint Chief Complaint  Patient presents with  . Cough    HPI Shawn Downs is a 21 y.o. male.  21 yo M with a chief complaint of cough congestion fevers. This been going on for the past 4 days. Patient feels that he is getting worse. Is starting to have some mild difficulty with breathing as well. Has been coughing up a lot of phlegm. Denies vomiting nausea myalgias.   The history is provided by the patient.  Cough  This is a new problem. The current episode started more than 2 days ago. The problem occurs constantly. The problem has not changed since onset.The cough is productive of sputum. The maximum temperature recorded prior to his arrival was 101 to 101.9 F. The fever has been present for 1 to 2 days. Associated symptoms include shortness of breath. Pertinent negatives include no chest pain, no chills, no headaches and no myalgias. He has tried nothing for the symptoms. The treatment provided no relief. He is not a smoker. His past medical history does not include COPD or asthma.    History reviewed. No pertinent past medical history.  There are no active problems to display for this patient.   History reviewed. No pertinent surgical history.     Home Medications    Prior to Admission medications   Medication Sig Start Date End Date Taking? Authorizing Provider  amoxicillin (AMOXIL) 500 MG capsule Take 2 capsules (1,000 mg total) by mouth 2 (two) times daily. 05/15/16   Melene Plan, DO  benzonatate (TESSALON) 100 MG capsule Take 1 capsule (100 mg total) by mouth every 8 (eight) hours. 05/15/16   Melene Plan, DO  HYDROcodone-acetaminophen (NORCO/VICODIN) 5-325 MG tablet Take 1-2 tablets by mouth every 6 (six) hours as needed. 06/18/15   Heather Laisure, PA-C  ibuprofen (ADVIL,MOTRIN) 800 MG tablet Take 1 tablet (800 mg total) by mouth 3 (three) times daily. 06/07/15    Danelle Berry, PA-C  methocarbamol (ROBAXIN) 500 MG tablet Take 1 tablet (500 mg total) by mouth 2 (two) times daily. 09/02/14   Elson Areas, PA-C  naproxen (NAPROSYN) 500 MG tablet Take 1 tablet (500 mg total) by mouth 2 (two) times daily. 06/18/15   Heather Laisure, PA-C  traMADol (ULTRAM) 50 MG tablet Take 1 tablet (50 mg total) by mouth every 12 (twelve) hours as needed for severe pain. 06/07/15   Danelle Berry, PA-C    Family History No family history on file.  Social History Social History  Substance Use Topics  . Smoking status: Former Games developer  . Smokeless tobacco: Never Used  . Alcohol use No     Allergies   Patient has no known allergies.   Review of Systems Review of Systems  Constitutional: Negative for chills and fever.  HENT: Positive for congestion. Negative for facial swelling.   Eyes: Negative for discharge and visual disturbance.  Respiratory: Positive for cough and shortness of breath.   Cardiovascular: Negative for chest pain and palpitations.  Gastrointestinal: Negative for abdominal pain, diarrhea and vomiting.  Musculoskeletal: Negative for arthralgias and myalgias.  Skin: Negative for color change and rash.  Neurological: Negative for tremors, syncope and headaches.  Psychiatric/Behavioral: Negative for confusion and dysphoric mood.     Physical Exam Updated Vital Signs BP 158/92 (BP Location: Left Arm)   Pulse 102   Temp 98 F (36.7 C) (Oral)  Resp 24   Ht 6\' 1"  (1.854 m)   Wt 218 lb (98.9 kg)   SpO2 97%   BMI 28.76 kg/m   Physical Exam  Constitutional: He is oriented to person, place, and time. He appears well-developed and well-nourished.  HENT:  Head: Normocephalic and atraumatic.  Swollen turbinates, posterior nasal drip, no noted sinus ttp. Bilateral TMs with purulent effusion.    Eyes: EOM are normal. Pupils are equal, round, and reactive to light.  Neck: Normal range of motion. Neck supple. No JVD present.  Cardiovascular: Normal  rate and regular rhythm.  Exam reveals no gallop and no friction rub.   No murmur heard. Pulmonary/Chest: No respiratory distress. He has no wheezes.  Transmitted upper airway noises.  Abdominal: He exhibits no distension and no mass. There is no tenderness. There is no rebound and no guarding.  Musculoskeletal: Normal range of motion.  Neurological: He is alert and oriented to person, place, and time.  Skin: No rash noted. No pallor.  Psychiatric: He has a normal mood and affect. His behavior is normal.  Nursing note and vitals reviewed.    ED Treatments / Results  Labs (all labs ordered are listed, but only abnormal results are displayed) Labs Reviewed - No data to display  EKG  EKG Interpretation None       Radiology Dg Chest 2 View  Result Date: 05/15/2016 CLINICAL DATA:  Cough, shortness of breath. EXAM: CHEST  2 VIEW COMPARISON:  None. FINDINGS: The heart size and mediastinal contours are within normal limits. Both lungs are clear. No pneumothorax or pleural effusion is noted. The visualized skeletal structures are unremarkable. IMPRESSION: No active cardiopulmonary disease. Electronically Signed   By: Lupita Raider, M.D.   On: 05/15/2016 11:51    Procedures Procedures (including critical care time)  Medications Ordered in ED Medications  albuterol (PROVENTIL) (2.5 MG/3ML) 0.083% nebulizer solution (not administered)  albuterol (PROVENTIL) (2.5 MG/3ML) 0.083% nebulizer solution 5 mg (5 mg Nebulization Given 05/15/16 1002)  acetaminophen (TYLENOL) tablet 1,000 mg (1,000 mg Oral Given 05/15/16 1153)  ibuprofen (ADVIL,MOTRIN) tablet 800 mg (800 mg Oral Given 05/15/16 1153)  chlorpheniramine-HYDROcodone (TUSSIONEX) 10-8 MG/5ML suspension 5 mL (5 mLs Oral Given 05/15/16 1152)  amoxicillin (AMOXIL) capsule 1,000 mg (1,000 mg Oral Given 05/15/16 1153)     Initial Impression / Assessment and Plan / ED Course  I have reviewed the triage vital signs and the nursing  notes.  Pertinent labs & imaging results that were available during my care of the patient were reviewed by me and considered in my medical decision making (see chart for details).     21 yo M With a chief complaint of URI like symptoms. Patient has transmitted upper airway noises and significant postnasal drip. He is given an albuterol treatment with no improvement at all. I do not suspect that this is bronchospasm. Will obtain a chest x-ray to rule out pneumonia. Give the patient symptomatic therapy.  CXR negative, d/c home.   12:10 PM:  I have discussed the diagnosis/risks/treatment options with the patient and family and believe the pt to be eligible for discharge home to follow-up with PCP. We also discussed returning to the ED immediately if new or worsening sx occur. We discussed the sx which are most concerning (e.g., sudden worsening pain, fever, inability to tolerate by mouth) that necessitate immediate return. Medications administered to the patient during their visit and any new prescriptions provided to the patient are listed below.  Medications given  during this visit Medications  albuterol (PROVENTIL) (2.5 MG/3ML) 0.083% nebulizer solution (not administered)  albuterol (PROVENTIL) (2.5 MG/3ML) 0.083% nebulizer solution 5 mg (5 mg Nebulization Given 05/15/16 1002)  acetaminophen (TYLENOL) tablet 1,000 mg (1,000 mg Oral Given 05/15/16 1153)  ibuprofen (ADVIL,MOTRIN) tablet 800 mg (800 mg Oral Given 05/15/16 1153)  chlorpheniramine-HYDROcodone (TUSSIONEX) 10-8 MG/5ML suspension 5 mL (5 mLs Oral Given 05/15/16 1152)  amoxicillin (AMOXIL) capsule 1,000 mg (1,000 mg Oral Given 05/15/16 1153)     The patient appears reasonably screen and/or stabilized for discharge and I doubt any other medical condition or other [facility] requiring further screening, evaluation, or treatment in the ED at this time prior to discharge.    Final Clinical Impressions(s) / ED Diagnoses   Final diagnoses:  Acute  bacterial otitis media, bilateral  Viral upper respiratory tract infection    New Prescriptions New Prescriptions   AMOXICILLIN (AMOXIL) 500 MG CAPSULE    Take 2 capsules (1,000 mg total) by mouth 2 (two) times daily.   BENZONATATE (TESSALON) 100 MG CAPSULE    Take 1 capsule (100 mg total) by mouth every 8 (eight) hours.     Melene Planan Bharat Antillon, DO 05/15/16 1210

## 2016-05-15 NOTE — ED Notes (Signed)
Got patient into a gown and on the monitor 

## 2016-05-15 NOTE — ED Triage Notes (Signed)
Per Pt, Pt reports having productive cough and congestion x4 days. Reports yesterday having more trouble breathing. Denies any Hx of asthma. Pt has audible wheezing noted.

## 2016-05-15 NOTE — Discharge Instructions (Signed)
Take tylenol 2 pills 4 times a day and motrin 4 pills 3 times a day.  Drink plenty of fluids.  Return for worsening shortness of breath, headache, confusion. Follow up with your family doctor.   
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
## 2018-07-23 ENCOUNTER — Emergency Department (HOSPITAL_COMMUNITY)
Admission: EM | Admit: 2018-07-23 | Discharge: 2018-07-23 | Disposition: A | Discharge status: Home / Self Care | Payer: Self-pay | Attending: Emergency Medicine | Admitting: Emergency Medicine

## 2018-07-23 ENCOUNTER — Emergency Department (HOSPITAL_COMMUNITY): Payer: Self-pay

## 2018-07-23 ENCOUNTER — Encounter (HOSPITAL_COMMUNITY): Payer: Self-pay | Admitting: Emergency Medicine

## 2018-07-23 DIAGNOSIS — Y999 Unspecified external cause status: Secondary | ICD-10-CM | POA: Insufficient documentation

## 2018-07-23 DIAGNOSIS — S43014A Anterior dislocation of right humerus, initial encounter: Secondary | ICD-10-CM | POA: Insufficient documentation

## 2018-07-23 DIAGNOSIS — Y929 Unspecified place or not applicable: Secondary | ICD-10-CM | POA: Insufficient documentation

## 2018-07-23 DIAGNOSIS — X509XXA Other and unspecified overexertion or strenuous movements or postures, initial encounter: Secondary | ICD-10-CM | POA: Insufficient documentation

## 2018-07-23 DIAGNOSIS — Y939 Activity, unspecified: Secondary | ICD-10-CM | POA: Insufficient documentation

## 2018-07-23 DIAGNOSIS — Z87891 Personal history of nicotine dependence: Secondary | ICD-10-CM | POA: Insufficient documentation

## 2018-07-23 NOTE — Discharge Instructions (Addendum)
Wear arm sling for the next several days, then slowly reintroduce activity as tolerated.  Ice for 20 minutes every 2 hours while awake for the next 2 days.  Return to the emergency department if symptoms worsen or change.

## 2018-07-23 NOTE — ED Notes (Signed)
Sling applied to right arm.

## 2018-07-23 NOTE — ED Provider Notes (Signed)
[facility]-EMERGENCY DEPT Provider Note   CSN: [id number] Arrival date & time: 07/23/18  0006    History   Chief Complaint Chief Complaint  Patient presents with  . R shoulder dislocation    HPI Shawn Downs is a 23 y.o. male.     Patient is a 23 year old male with no significant past medical history.  He presents with right shoulder pain.  He states he was swatting at a bug when he felt his shoulder pop followed by pain.  He denies any numbness or tingling.  He denies any other issues.  Pain is worse with movement and change in position.  There are no alleviating factors.  The history is provided by the patient.    History reviewed. No pertinent past medical history.  There are no active problems to display for this patient.   History reviewed. No pertinent surgical history.      Home Medications    Prior to Admission medications   Medication Sig Start Date End Date Taking? Authorizing Provider  amoxicillin (AMOXIL) 500 MG capsule Take 2 capsules (1,000 mg total) by mouth 2 (two) times daily. 05/15/16   Melene PlanFloyd, Dan, DO  benzonatate (TESSALON) 100 MG capsule Take 1 capsule (100 mg total) by mouth every 8 (eight) hours. 05/15/16   Melene PlanFloyd, Dan, DO  HYDROcodone-acetaminophen (NORCO/VICODIN) 5-325 MG tablet Take 1-2 tablets by mouth every 6 (six) hours as needed. 06/18/15   Santiago GladLaisure, Heather, PA-C  ibuprofen (ADVIL,MOTRIN) 800 MG tablet Take 1 tablet (800 mg total) by mouth 3 (three) times daily. 06/07/15   Danelle Berryapia, Leisa, PA-C  methocarbamol (ROBAXIN) 500 MG tablet Take 1 tablet (500 mg total) by mouth 2 (two) times daily. 09/02/14   Elson AreasSofia, Leslie K, PA-C  naproxen (NAPROSYN) 500 MG tablet Take 1 tablet (500 mg total) by mouth 2 (two) times daily. 06/18/15   Santiago GladLaisure, Heather, PA-C  traMADol (ULTRAM) 50 MG tablet Take 1 tablet (50 mg total) by mouth every 12 (twelve) hours as needed for severe pain. 06/07/15   Danelle Berryapia, Leisa, PA-C    Family History History  reviewed. No pertinent family history.  Social History Social History   Tobacco Use  . Smoking status: Former Games developermoker  . Smokeless tobacco: Never Used  Substance Use Topics  . Alcohol use: Yes  . Drug use: No     Allergies   Patient has no known allergies.   Review of Systems Review of Systems  All other systems reviewed and are negative.    Physical Exam Updated Vital Signs Pulse (!) 117   Temp 98.3 F (36.8 C) (Oral)   Resp 18   SpO2 99%   Physical Exam Vitals signs and nursing note reviewed.  Constitutional:      Appearance: Normal appearance.  HENT:     Head: Normocephalic and atraumatic.  Pulmonary:     Effort: Pulmonary effort is normal.  Musculoskeletal:     Comments: The right shoulder has an obvious glenohumeral dislocation.  Ulnar and radial pulses are easily palpable.  Sensation is intact throughout the entire hand.  He is able to flex, extend, and oppose all fingers.  Skin:    General: Skin is warm and dry.  Neurological:     Mental Status: He is alert.      ED Treatments / Results  Labs (all labs ordered are listed, but only abnormal results are displayed) Labs Reviewed - No data to display  EKG None  Radiology No results found.  Procedures Reduction  of dislocation Date/Time: 07/23/2018 12:32 AM Performed by: Geoffery Lyons, MD Authorized by: Geoffery Lyons, MD  Consent: Verbal consent obtained. Risks and benefits: risks, benefits and alternatives were discussed Consent given by: patient Patient understanding: patient states understanding of the procedure being performed Patient consent: the patient's understanding of the procedure matches consent given Procedure consent: procedure consent matches procedure scheduled Relevant documents: relevant documents present and verified Test results: test results available and properly labeled Imaging studies: imaging studies available Patient identity confirmed: verbally with patient and arm  band Local anesthesia used: no  Anesthesia: Local anesthesia used: no  Sedation: Patient sedated: no  Patient tolerance: Patient tolerated the procedure well with no immediate complications    (including critical care time)  Medications Ordered in ED Medications - No data to display   Initial Impression / Assessment and Plan / ED Course  I have reviewed the triage vital signs and the nursing notes.  Pertinent labs & imaging results that were available during my care of the patient were reviewed by me and considered in my medical decision making (see chart for details).  Patient presenting with right shoulder dislocation.  Patient very stoic and tolerated reduction without sedation.  The shoulder was easily reduced.  Placement confirmed with post reduction film.  Final Clinical Impressions(s) / ED Diagnoses   Final diagnoses:  None    ED Discharge Orders    None       Geoffery Lyons, MD 07/23/18 [phone]

## 2018-07-23 NOTE — ED Triage Notes (Signed)
R shoulder dislocation while attempting to kill insect

## 2018-07-23 NOTE — ED Notes (Signed)
Discharge instructions discussed with patient. Patient verbalizes understanding and has no questions at this time.

## 2020-06-17 ENCOUNTER — Emergency Department (HOSPITAL_COMMUNITY)
Admission: EM | Admit: 2020-06-17 | Discharge: 2020-06-17 | Disposition: A | Discharge status: Home / Self Care | Payer: BC Managed Care – PPO | Attending: Emergency Medicine | Admitting: Emergency Medicine

## 2020-06-17 ENCOUNTER — Emergency Department (HOSPITAL_COMMUNITY): Payer: BC Managed Care – PPO

## 2020-06-17 ENCOUNTER — Other Ambulatory Visit: Payer: Self-pay

## 2020-06-17 ENCOUNTER — Encounter (HOSPITAL_COMMUNITY): Payer: Self-pay | Admitting: Emergency Medicine

## 2020-06-17 DIAGNOSIS — S43004A Unspecified dislocation of right shoulder joint, initial encounter: Secondary | ICD-10-CM | POA: Diagnosis not present

## 2020-06-17 DIAGNOSIS — Z87891 Personal history of nicotine dependence: Secondary | ICD-10-CM | POA: Diagnosis not present

## 2020-06-17 DIAGNOSIS — W19XXXA Unspecified fall, initial encounter: Secondary | ICD-10-CM | POA: Diagnosis not present

## 2020-06-17 DIAGNOSIS — S4991XA Unspecified injury of right shoulder and upper arm, initial encounter: Secondary | ICD-10-CM | POA: Diagnosis present

## 2020-06-17 MED ORDER — HYDROMORPHONE HCL 1 MG/ML IJ SOLN
1.0000 mg | Freq: Once | INTRAMUSCULAR | Status: AC
  Administered 2020-06-17: 1 mg via INTRAVENOUS
  Filled 2020-06-17: qty 1

## 2020-06-17 NOTE — ED Provider Notes (Signed)
Emergency Department Provider Note  I have reviewed the triage vital signs and the nursing notes.  HISTORY  Chief Complaint Fall   HPI Shawn Downs is a 25 y.o. male with a history of right shoulder this location the presents the emerge department today with pain in the same after a fall.  Patient was reaching for something fell over landing on her shoulder and now has pain and deformity to the area.  Hesitating for symptoms.  Is reduced ability to use his hand on the right side there.  No other associated symptoms.  No other injuries.    No other associated or modifying symptoms.    History reviewed. No pertinent past medical history.  There are no problems to display for this patient.   History reviewed. No pertinent surgical history.  Current Outpatient Rx  . Order #: [id number] Class: Print  . Order #: [id number] Class: Print  . Order #: [id number] Class: Print  . Order #: [id number] Class: Print  . Order #: [id number] Class: Print  . Order #: [id number] Class: Print  . Order #: [id number] Class: Print    Allergies Patient has no known allergies.  History reviewed. No pertinent family history.  Social History Social History   Tobacco Use  . Smoking status: Former Games developer  . Smokeless tobacco: Never Used  Substance Use Topics  . Alcohol use: Yes  . Drug use: No    Review of Systems  All other systems negative except as documented in the HPI. All pertinent positives and negatives as reviewed in the HPI. ____________________________________________  PHYSICAL EXAM:  VITAL SIGNS: ED Triage Vitals  Enc Vitals Group     BP 06/17/20 0123 (!) 183/116     Pulse Rate 06/17/20 0123 (!) 121     Resp 06/17/20 0123 18     Temp 06/17/20 0123 98.5 F (36.9 C)     Temp Source 06/17/20 0123 Oral     SpO2 06/17/20 0123 98 %     Weight 06/17/20 0124 230 lb (104.3 kg)     Height 06/17/20 0124 6' 1.5" (1.867 m)     Head Circumference --      Peak Flow --      Pain Score  06/17/20 0124 10     Pain Loc --      Pain Edu? --      Excl. in GC? --     Constitutional: Alert and oriented. Well appearing and in no acute distress. Eyes: Conjunctivae are normal. PERRL. EOMI. Head: Atraumatic. Nose: No congestion/rhinnorhea. Mouth/Throat: Mucous membranes are moist.  Oropharynx non-erythematous. Neck: No stridor.  No meningeal signs.   Cardiovascular: Normal rate, regular rhythm. Good peripheral circulation. Grossly normal heart sounds.   Respiratory: Normal respiratory effort.  No retractions. Lungs CTAB. Gastrointestinal: Soft and nontender. No distention.  Musculoskeletal: No lower extremity tenderness nor edema. Does have right shoulder deformity and reduced ROM secondary to pain and anatomy.  Neurologic:  Normal speech and language. No gross focal neurologic deficits are appreciated.  Skin:  Skin is warm, dry and intact. No rash noted.  ____________________________________________    RADIOLOGY  DG Shoulder Right  Result Date: 06/17/2020 CLINICAL DATA:  Status post reduction EXAM: RIGHT SHOULDER - 2+ VIEW COMPARISON:  06/17/2020 FINDINGS: Interval reduction of right shoulder dislocation, now with normal alignment. No fracture is seen IMPRESSION: Reduction of right shoulder dislocation. Electronically Signed   By: Jasmine Pang M.D.   On: 06/17/2020 02:57   DG Shoulder Right  Result Date: 06/17/2020 CLINICAL DATA:  Shoulder pain after fall EXAM: RIGHT SHOULDER - 2+ VIEW COMPARISON:  07/23/2018 FINDINGS: Anterior inferior dislocation of the humeral head with respect to the glenoid fossa. No fracture seen. The [facility] joint is intact IMPRESSION: Anterior shoulder dislocation. Electronically Signed   By: Jasmine Pang M.D.   On: 06/17/2020 01:50   ____________________________________________  PROCEDURES  Procedure(s) performed:   .Ortho Injury Treatment  Date/Time: 06/17/2020 3:12 AM Performed by: Marily Memos, MD Authorized by: Marily Memos, MD    Consent:    Consent obtained:  Verbal   Consent given by:  Patient   Risks discussed:  Fracture, irreducible dislocation, recurrent dislocation, restricted joint movement, nerve damage, stiffness and vascular damage   Alternatives discussed:  No treatment, alternative treatment, immobilization, referral and delayed treatmentInjury location: shoulder Location details: right shoulder Injury type: dislocation Dislocation type: anterior Hill-Sachs deformity: no Chronicity: recurrent Pre-procedure neurovascular assessment: neurovascularly intact Pre-procedure distal perfusion: normal Pre-procedure neurological function: normal Pre-procedure range of motion: reduced  Anesthesia: Local anesthesia used: no  Patient sedated: NoManipulation performed: yes Reduction method: traction and counter traction and external rotation Reduction successful: yes X-ray confirmed reduction: yes Immobilization: sling Post-procedure neurovascular assessment: post-procedure neurovascularly intact Post-procedure distal perfusion: normal Post-procedure neurological function: normal Post-procedure range of motion: improved    ____________________________________________  INITIAL IMPRESSION / ASSESSMENT AND PLAN    This patient presents to the ED for concern of shoulder pain, this involves an extensive number of treatment options, and is a complaint that carries with it a high risk of complications and morbidity.  The differential diagnosis includes fracture, dislocation, strain or sprain.   Additional history obtained:   Additional history obtained from noon  Previous records obtained and reviewed in epic  ED Course      Medicines ordered:  Medications  HYDROmorphone (DILAUDID) injection 1 mg (1 mg Intravenous Given 06/17/20 0208)     Consultations Obtained:   I consulted noone  and discussed lab and imaging findings   CRITICAL INTERVENTIONS:  .   Reevaluation:  After the  interventions stated above, I reevaluated the patient and found Reduced shoulder. Feels better. xr confirmed.   FINAL IMPRESSION AND PLAN Final diagnoses:  None   A medical screening exam was performed and I feel the patient has had an appropriate workup for their chief complaint at this time and likelihood of emergent condition existing is low. They have been counseled on decision, DISCHARGE, follow up and which symptoms necessitate immediate return to the emergency department. They or their family verbally stated understanding and agreement with plan and discharged in stable condition.   ____________________________________________   NEW OUTPATIENT MEDICATIONS STARTED DURING THIS VISIT:  New Prescriptions   No medications on file    Note:  This note was prepared with assistance of Dragon voice recognition software. Occasional wrong-word or sound-a-like substitutions may have occurred due to the inherent limitations of voice recognition software.   Dub Maclellan, Barbara Cower, MD 06/17/20 [phone]

## 2020-06-17 NOTE — ED Triage Notes (Signed)
Patient was getting his phone from under the couch and fell on his right shoulder. Patient is complaining of pain on the right shoulder.

## 2020-07-29 ENCOUNTER — Emergency Department (HOSPITAL_COMMUNITY)
Admission: EM | Admit: 2020-07-29 | Discharge: 2020-07-30 | Disposition: A | Discharge status: Home / Self Care | Payer: BC Managed Care – PPO | Attending: Emergency Medicine | Admitting: Emergency Medicine

## 2020-07-29 ENCOUNTER — Emergency Department (HOSPITAL_COMMUNITY): Payer: BC Managed Care – PPO

## 2020-07-29 ENCOUNTER — Other Ambulatory Visit: Payer: Self-pay

## 2020-07-29 DIAGNOSIS — M549 Dorsalgia, unspecified: Secondary | ICD-10-CM

## 2020-07-29 DIAGNOSIS — Y92410 Unspecified street and highway as the place of occurrence of the external cause: Secondary | ICD-10-CM | POA: Insufficient documentation

## 2020-07-29 DIAGNOSIS — S32019A Unspecified fracture of first lumbar vertebra, initial encounter for closed fracture: Secondary | ICD-10-CM

## 2020-07-29 DIAGNOSIS — S3992XA Unspecified injury of lower back, initial encounter: Secondary | ICD-10-CM | POA: Diagnosis present

## 2020-07-29 DIAGNOSIS — Z87891 Personal history of nicotine dependence: Secondary | ICD-10-CM | POA: Insufficient documentation

## 2020-07-29 DIAGNOSIS — R109 Unspecified abdominal pain: Secondary | ICD-10-CM | POA: Diagnosis not present

## 2020-07-29 DIAGNOSIS — R000 Tachycardia, unspecified: Secondary | ICD-10-CM | POA: Diagnosis not present

## 2020-07-29 DIAGNOSIS — S32010A Wedge compression fracture of first lumbar vertebra, initial encounter for closed fracture: Secondary | ICD-10-CM | POA: Diagnosis not present

## 2020-07-29 MED ORDER — LIDOCAINE 5 % EX PTCH: 1.0000 | MEDICATED_PATCH | CUTANEOUS | 0 refills | Status: AC

## 2020-07-29 MED ORDER — ONDANSETRON HCL 4 MG/2ML IJ SOLN
4.0000 mg | Freq: Once | INTRAMUSCULAR | Status: DC
  Filled 2020-07-29: qty 2

## 2020-07-29 MED ORDER — MORPHINE SULFATE (PF) 4 MG/ML IV SOLN
4.0000 mg | Freq: Once | INTRAVENOUS | Status: AC
  Administered 2020-07-29: 4 mg via INTRAMUSCULAR
  Filled 2020-07-29: qty 1

## 2020-07-29 MED ORDER — ONDANSETRON HCL 4 MG/2ML IJ SOLN: 4.0000 mg | Freq: Once | INTRAMUSCULAR | Status: DC

## 2020-07-29 MED ORDER — METHOCARBAMOL 500 MG PO TABS: 500.0000 mg | ORAL_TABLET | Freq: Two times a day (BID) | ORAL | 0 refills | Status: DC

## 2020-07-29 MED ORDER — MORPHINE SULFATE (PF) 4 MG/ML IV SOLN
4.0000 mg | Freq: Once | INTRAVENOUS | Status: AC
  Administered 2020-07-29: 4 mg via INTRAMUSCULAR

## 2020-07-29 MED ORDER — ONDANSETRON HCL 4 MG PO TABS
4.0000 mg | ORAL_TABLET | Freq: Once | ORAL | Status: AC
  Administered 2020-07-29: 4 mg via ORAL
  Filled 2020-07-29: qty 1

## 2020-07-29 MED ORDER — OXYCODONE HCL 5 MG PO TABS: 5.0000 mg | ORAL_TABLET | ORAL | 0 refills | Status: AC | PRN

## 2020-07-29 MED ORDER — MORPHINE SULFATE (PF) 4 MG/ML IV SOLN
4.0000 mg | Freq: Once | INTRAVENOUS | Status: DC
  Filled 2020-07-29: qty 1

## 2020-07-29 MED ORDER — MORPHINE SULFATE (PF) 4 MG/ML IV SOLN
4.0000 mg | Freq: Once | INTRAVENOUS | Status: DC
Start: 2020-07-29 — End: 2020-07-29

## 2020-07-29 NOTE — Progress Notes (Signed)
Orthopedic Tech Progress Note Patient Details:  Shawn Downs 08/13/1995 [id number]  Patient ID: [name], male   DOB: June 06, 1995, 25 y.o.   MRN: [id number] Called order into [facility]anger  Trinna Post 07/29/2020, 10:10 PM
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
## 2020-07-29 NOTE — ED Notes (Signed)
Patient transported to X-ray 
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
## 2020-07-29 NOTE — ED Notes (Signed)
Ortho tech called for splint.

## 2020-07-29 NOTE — Discharge Instructions (Addendum)
Today we found a compression fracture at L1 on your back. I am giving you a referral for neurosurgery. You will need to call them Monday morning to set up an appointment. I've written you a work note for the next 5 days. You also have a fracture to your fifth metatarsal. You will need to call the hand specialist and set up an appointment for this week. Keep the ulnar gutter cast dry. Take your pain medicine as needed with the nausea medicine. If you develop fever, urinary retention, bilateral leg numbness or become unable to walk please return to the ED.

## 2020-07-30 NOTE — Progress Notes (Signed)
Orthopedic Tech Progress Note Patient Details:  Shawn Downs [birth year] [id number]  Ortho Devices Type of Ortho Device: Ulna gutter splint Ortho Device/Splint Location: lue. left fingers unbent to use splint to immobilize hand. approved by dr. Gaylord Shih Device/Splint Interventions: Ordered,Application,Adjustment   Post Interventions Patient Tolerated: Well Instructions Provided: Care of device,Adjustment of device   Trinna Post 07/30/2020, 12:05 AM

## 2020-07-30 NOTE — ED Notes (Signed)
Pt verbalized understanding of discharge instructions, care of TLSO brace and splint at home and follow up appointments. Answered all questions at this time.

## 2020-08-01 DIAGNOSIS — S62357A Nondisplaced fracture of shaft of fifth metacarpal bone, left hand, initial encounter for closed fracture: Secondary | ICD-10-CM | POA: Insufficient documentation

## 2022-01-10 ENCOUNTER — Emergency Department (HOSPITAL_COMMUNITY): Payer: BC Managed Care – PPO

## 2022-01-10 ENCOUNTER — Emergency Department (HOSPITAL_COMMUNITY)
Admission: EM | Admit: 2022-01-10 | Discharge: 2022-01-10 | Disposition: A | Discharge status: Home / Self Care | Payer: BC Managed Care – PPO | Attending: Emergency Medicine | Admitting: Emergency Medicine

## 2022-01-10 ENCOUNTER — Encounter (HOSPITAL_COMMUNITY): Payer: Self-pay

## 2022-01-10 DIAGNOSIS — S43004A Unspecified dislocation of right shoulder joint, initial encounter: Secondary | ICD-10-CM

## 2022-01-10 MED ORDER — HYDROMORPHONE HCL 1 MG/ML IJ SOLN: 1.0000 mg | Freq: Once | INTRAMUSCULAR | Status: DC

## 2022-01-10 MED ORDER — HYDROCODONE-ACETAMINOPHEN 5-325 MG PO TABS
1.0000 | ORAL_TABLET | Freq: Once | ORAL | Status: AC
  Administered 2022-01-10: 1 via ORAL
  Filled 2022-01-10: qty 1
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
## 2023-02-18 IMAGING — DX DG SHOULDER 2+V*R*
2 series · 2 of 2 positions shown · non-contrast
Comparison: 06/17/2020

CLINICAL DATA: Status post reduction

EXAM:
RIGHT SHOULDER - 2+ VIEW

[shoulder ap]
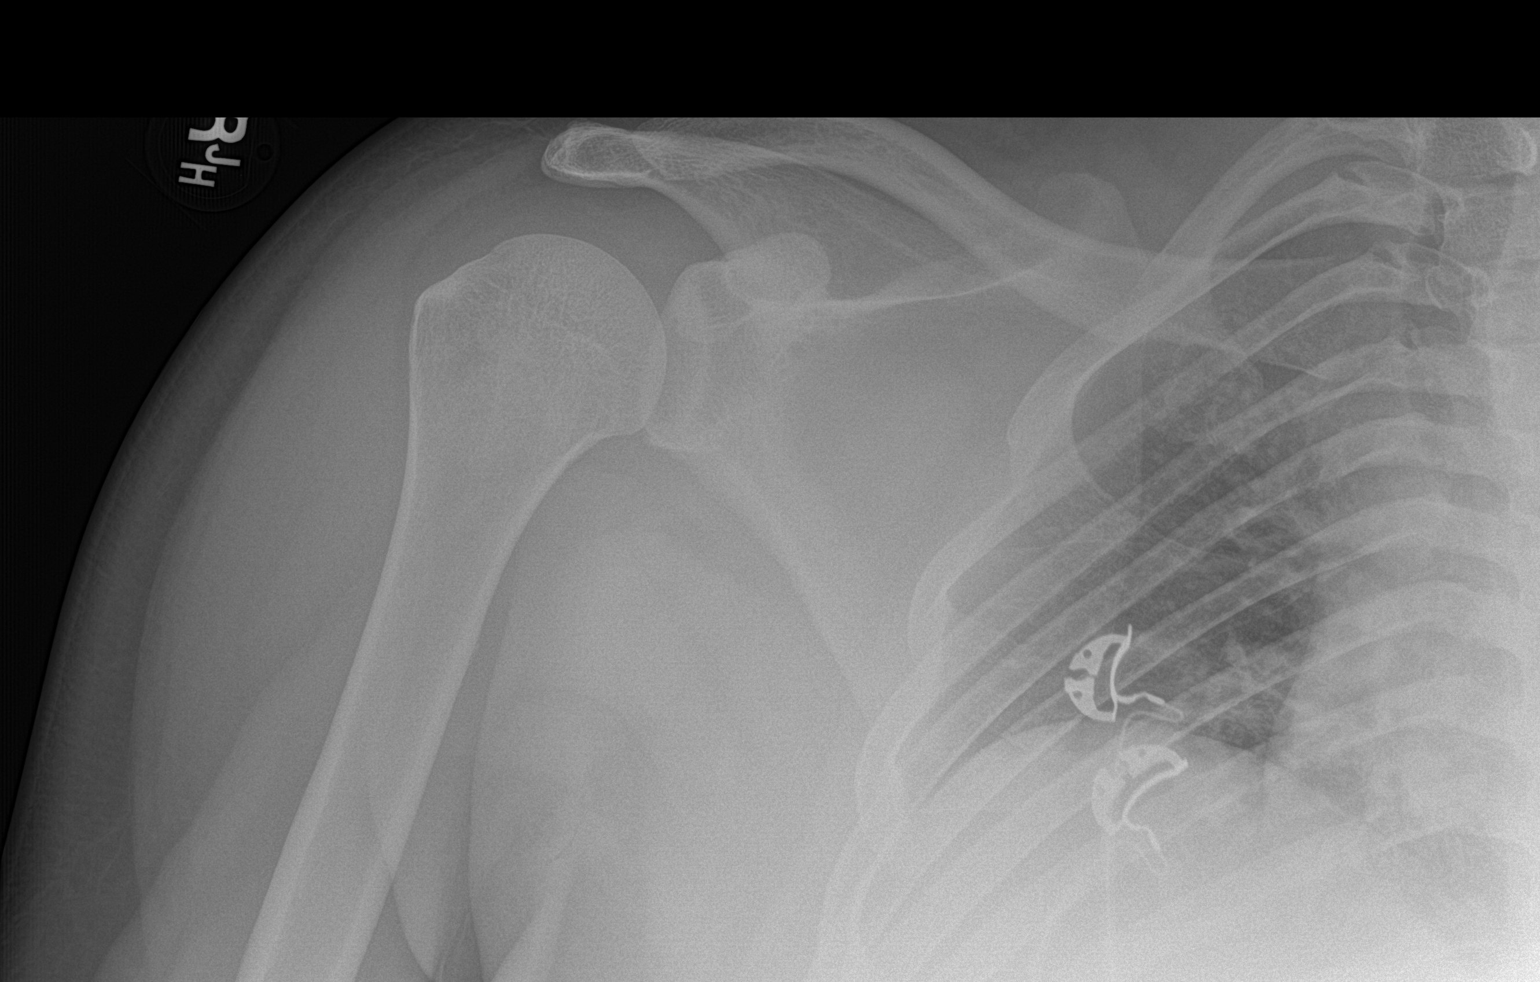

[shoulder obl]
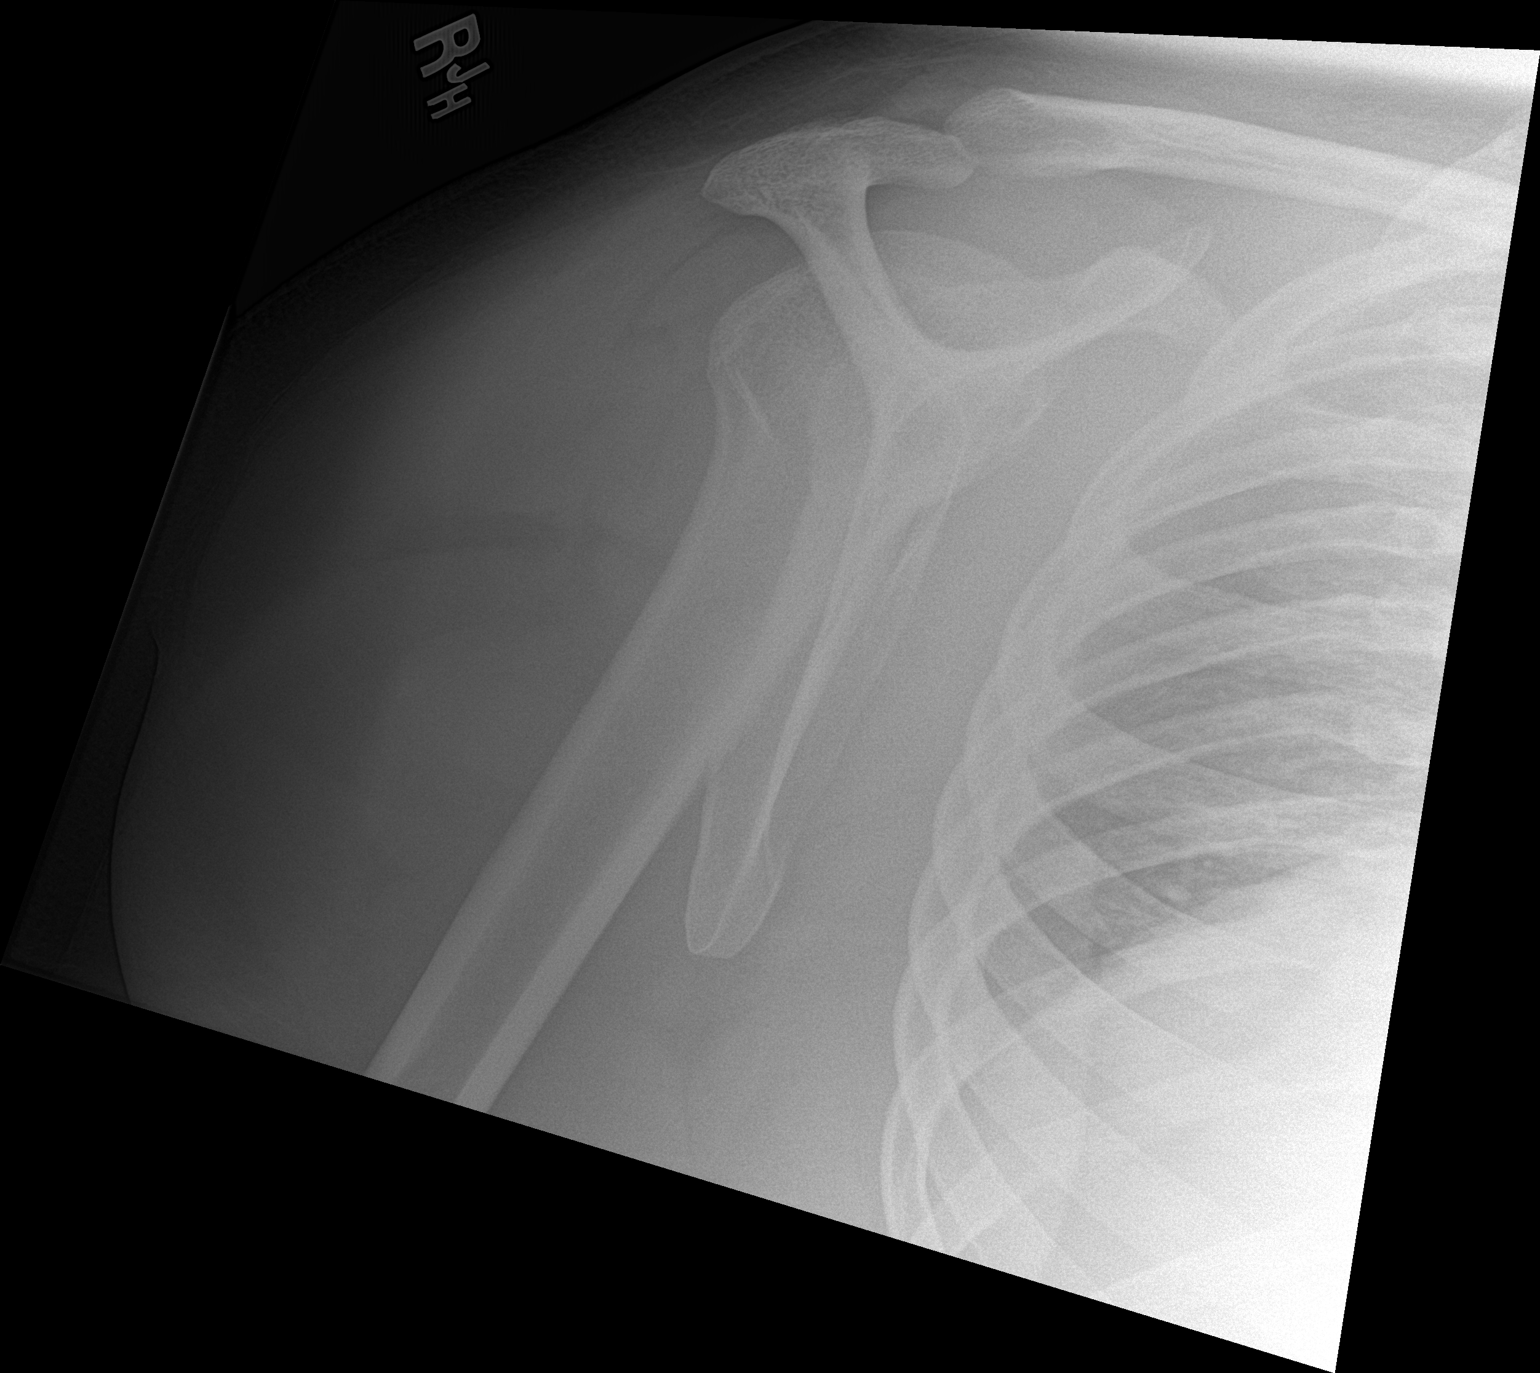

[2 of 2 positions shown; findings below may reference images not displayed]

FINDINGS: Interval reduction of right shoulder dislocation, now with normal
alignment. No fracture is seen
IMPRESSION: Reduction of right shoulder dislocation.

## 2023-09-04 ENCOUNTER — Ambulatory Visit: Payer: Self-pay | Admitting: Urgent Care

## 2023-09-04 ENCOUNTER — Encounter: Payer: Self-pay | Admitting: Emergency Medicine

## 2023-09-04 ENCOUNTER — Ambulatory Visit
Admission: EM | Admit: 2023-09-04 | Discharge: 2023-09-04 | Disposition: A | Discharge status: Home / Self Care | Attending: Family Medicine | Admitting: Family Medicine

## 2023-09-04 ENCOUNTER — Ambulatory Visit (INDEPENDENT_AMBULATORY_CARE_PROVIDER_SITE_OTHER)

## 2023-09-04 DIAGNOSIS — M25511 Pain in right shoulder: Secondary | ICD-10-CM | POA: Diagnosis not present

## 2023-09-04 DIAGNOSIS — Z8739 Personal history of other diseases of the musculoskeletal system and connective tissue: Secondary | ICD-10-CM | POA: Diagnosis not present

## 2023-09-04 MED ORDER — CYCLOBENZAPRINE HCL 5 MG PO TABS: 5.0000 mg | ORAL_TABLET | Freq: Every evening | ORAL | 0 refills | Status: DC | PRN

## 2023-09-04 MED ORDER — NAPROXEN 500 MG PO TABS: 500.0000 mg | ORAL_TABLET | Freq: Two times a day (BID) | ORAL | 0 refills | Status: AC

## 2023-09-04 NOTE — ED Triage Notes (Signed)
 Pt presents c/o right shoulder pain. Pt had a dislocated shoulder when he woke this morning. Pt says it popped back into place on his way to the hospital but now he has a dull and constant pain on back side of right shoulder. Pt denies injury.

## 2023-09-04 NOTE — ED Provider Notes (Signed)
 [facility] - URGENT CARE CENTER  Note:  This document was prepared using Conservation officer, historic buildings and may include unintentional dictation errors.  MRN: [id number] DOB: January 03, 1996  Subjective:   Shawn Downs is a 28 y.o. male presenting for acute onset of right shoulder pain since this morning.  Patient reports that he woke up this morning with his shoulder out of place.  After about an hour he was able to pop it back into place.  Has a history of shoulder dislocations.  The last time it was dislocated, he had it put into place by a medical provider.  Has not had issues until this current episode.  He is able to move his arm but has some mild to moderate pain.  Does not see an orthopedist for his shoulder.  No current facility-administered medications for this encounter.  Current Outpatient Medications:    diclofenac (VOLTAREN) 75 MG EC tablet, Take by mouth., Disp: , Rfl:    HYDROcodone -acetaminophen  (NORCO/VICODIN) 5-325 MG tablet, 1 tablet., Disp: , Rfl:    ibuprofen  (ADVIL ) 200 MG tablet, Take 200 mg by mouth every 6 (six) hours as needed., Disp: , Rfl:    meloxicam (MOBIC) 15 MG tablet, Take 15 mg by mouth., Disp: , Rfl:    lidocaine  (LIDODERM ) 5 %, Place 1 patch onto the skin daily. Remove & Discard patch within 12 hours or as directed by MD, Disp: 30 patch, Rfl: 0   methocarbamol  (ROBAXIN ) 500 MG tablet, Take 1 tablet (500 mg total) by mouth 2 (two) times daily., Disp: 20 tablet, Rfl: 0   oxyCODONE  (ROXICODONE ) 5 MG immediate release tablet, Take 1 tablet (5 mg total) by mouth every 4 (four) hours as needed for severe pain., Disp: 15 tablet, Rfl: 0   No Known Allergies  History reviewed. No pertinent past medical history.   History reviewed. No pertinent surgical history.  History reviewed. No pertinent family history.  Social History   Tobacco Use   Smoking status: Former    Passive exposure: Never   Smokeless tobacco: Never  Vaping Use   Vaping status:  Never Used  Substance Use Topics   Alcohol use: Yes   Drug use: No    ROS   Objective:   Vitals: BP (!) 137/91 (BP Location: Left Arm)   Pulse 91   Temp 97.9 F (36.6 C) (Oral)   Resp 18   Wt 282 lb 9.6 oz (128.2 kg)   SpO2 97%   BMI 37.28 kg/m   Physical Exam Constitutional:      General: He is not in acute distress.    Appearance: Normal appearance. He is well-developed and normal weight. He is not ill-appearing, toxic-appearing or diaphoretic.  HENT:     Head: Normocephalic and atraumatic.     Right Ear: External ear normal.     Left Ear: External ear normal.     Nose: Nose normal.     Mouth/Throat:     Pharynx: Oropharynx is clear.   Eyes:     General: No scleral icterus.       Right eye: No discharge.        Left eye: No discharge.     Extraocular Movements: Extraocular movements intact.    Cardiovascular:     Rate and Rhythm: Normal rate.  Pulmonary:     Effort: Pulmonary effort is normal.   Musculoskeletal:     Right shoulder: Tenderness (about the lateral and posterior deltoid, AC joint) present. No swelling, deformity, effusion,  laceration, bony tenderness or crepitus. Normal range of motion. Normal strength.     Cervical back: Normal range of motion.   Neurological:     Mental Status: He is alert and oriented to person, place, and time.   Psychiatric:        Mood and Affect: Mood normal.        Behavior: Behavior normal.        Thought Content: Thought content normal.        Judgment: Judgment normal.     Assessment and Plan :   PDMP not reviewed this encounter.  1. Acute pain of right shoulder    X-ray over-read was pending at time of discharge, recommended follow up with only abnormal results. Otherwise will not call for negative over-read. Patient was in agreement.  Excellent range of motion for the right shoulder, minimal tenderness.  Recommended conservative management, naproxen , cyclobenzaprine.  Follow-up with Ortho soon as possible.   Counseled patient on potential for adverse effects with medications prescribed/recommended today, ER and return-to-clinic precautions discussed, patient verbalized understanding.    Christopher Savannah, [place] 09/04/23 1554

## 2024-03-31 ENCOUNTER — Emergency Department (HOSPITAL_COMMUNITY): Admission: EM | Admit: 2024-03-31 | Discharge: 2024-03-31 | Disposition: A | Discharge status: Home / Self Care

## 2024-03-31 ENCOUNTER — Other Ambulatory Visit: Payer: Self-pay

## 2024-03-31 ENCOUNTER — Emergency Department (HOSPITAL_COMMUNITY)

## 2024-03-31 DIAGNOSIS — M25511 Pain in right shoulder: Secondary | ICD-10-CM | POA: Insufficient documentation

## 2024-03-31 MED ORDER — KETOROLAC TROMETHAMINE 15 MG/ML IJ SOLN
15.0000 mg | Freq: Once | INTRAMUSCULAR | Status: AC
  Administered 2024-03-31: 15 mg via INTRAMUSCULAR
  Filled 2024-03-31: qty 1

## 2024-03-31 MED ORDER — METHOCARBAMOL 500 MG PO TABS: 500.0000 mg | ORAL_TABLET | Freq: Two times a day (BID) | ORAL | 0 refills | Status: AC

## 2024-03-31 NOTE — Discharge Instructions (Addendum)
 Is a pleasure taking care of you today.  Your workup was reassuring.  Your x-ray showed no evidence of residual dislocation or evidence of fracture.  I did treat your pain with an anti-inflammatory and placed you in an immobilizer sling.  Please stay in the sling for the next 24 to 48 hours.  I have also sent you home with a prescription for Robaxin , which is a muscle relaxer.  Please only take this at night, as it can cause drowsiness.  Exercise caution when operating heavy machinery.  You may continue to alternate between Tylenol  and ibuprofen  as needed for pain throughout the day.  Please return to the emergency department for any worsening of your symptoms or any other life-threatening illness.

## 2024-03-31 NOTE — ED Triage Notes (Signed)
 Patient injured his right shoulder this morning with pain while puling heavy stuff at home , felt a pop .

## 2024-03-31 NOTE — ED Provider Notes (Signed)
 " [place] [facility] Provider Note   CSN: [id number] Arrival date & time: 03/31/24  9463     Patient presents with: Right Shoulder Injury   Shawn Downs is a 29 y.o. male with no pertinent past medical history, who presents emergency department for evaluation of a right shoulder dislocation.  Patient states he felt a pop this morning.  However, when he arrived to the emergency department, he was able to pop it back into place.  He does still report some pain and tenderness surrounding the shoulder joint.  Patient states he has had multiple shoulder dislocations in the past and has learned how to put back into place.  He has not taken any medications prior to arrival.   HPI     Prior to Admission medications  Medication Sig Start Date End Date Taking? Authorizing Provider  methocarbamol  (ROBAXIN ) 500 MG tablet Take 1 tablet (500 mg total) by mouth 2 (two) times daily. 03/31/24  Yes Kordae Buonocore, Marry RAMAN, PA-C  diclofenac (VOLTAREN) 75 MG EC tablet Take by mouth. 08/02/20   [provider]  HYDROcodone -acetaminophen  (NORCO/VICODIN) 5-325 MG tablet 1 tablet. 08/08/20   [provider]  ibuprofen  (ADVIL ) 200 MG tablet Take 200 mg by mouth every 6 (six) hours as needed.    [provider]  lidocaine  (LIDODERM ) 5 % Place 1 patch onto the skin daily. Remove & Discard patch within 12 hours or as directed by MD 07/29/20   Henderly, Britni A, PA-C  meloxicam (MOBIC) 15 MG tablet Take 15 mg by mouth. 08/08/20   [provider]  naproxen  (NAPROSYN ) 500 MG tablet Take 1 tablet (500 mg total) by mouth 2 (two) times daily with a meal. 09/04/23   Christopher Savannah, PA-C  oxyCODONE  (ROXICODONE ) 5 MG immediate release tablet Take 1 tablet (5 mg total) by mouth every 4 (four) hours as needed for severe pain. 07/29/20   Henderly, Britni A, PA-C    Allergies: Patient has no known allergies.    Review of Systems  Musculoskeletal:  Positive for  arthralgias.    Updated Vital Signs BP (!) 141/95 (BP Location: Left Arm)   Pulse 77   Temp 97.8 F (36.6 C)   Resp 18   SpO2 99%   Physical Exam Vitals and nursing note reviewed.  Constitutional:      Appearance: Normal appearance. He is not ill-appearing.  Eyes:     General: No scleral icterus. Pulmonary:     Effort: Pulmonary effort is normal. No respiratory distress.  Musculoskeletal:        General: Tenderness present. No deformity.     Comments: Patient with tenderness to palpation of the right shoulder joint.  Radial pulse intact.  Skin:    Coloration: Skin is not jaundiced.  Neurological:     General: No focal deficit present.     Mental Status: He is alert.  Psychiatric:        Mood and Affect: Mood normal.     (all labs ordered are listed, but only abnormal results are displayed) Labs Reviewed - No data to display  EKG: None  Radiology: DG Shoulder Right Result Date: 03/31/2024 CLINICAL DATA:  Pain.  Injury. EXAM: RIGHT SHOULDER - 2+ VIEW COMPARISON:  None Available. FINDINGS: There is no evidence of fracture or dislocation. There is no evidence of arthropathy or other focal bone abnormality. Soft tissues are unremarkable. IMPRESSION: Negative. Electronically Signed   By: Camellia Candle M.D.   On:  03/31/2024 06:00    Procedures   Medications Ordered in the ED  ketorolac  (TORADOL ) 15 MG/ML injection 15 mg (has no administration in time range)       Medical Decision Making Amount and/or Complexity of Data Reviewed Radiology: ordered.  Risk Prescription drug management.   29 year old male who presents emergency department for evaluation of right shoulder pain.  Differential diagnosis: Shoulder dislocation, humeral head fracture, septic arthritis, muscle strain, muscle sprain.  Shoulder x-ray is unremarkable.  I personally viewed and interpreted these images and agree with radiologist interpretation.  I do think patient likely dislocated his shoulder  prior to arrival, and that is why he is still reporting residual pain.  No additional workup indicated at this time.  I did give him Toradol  to treat his pain.  Patient has been placed in a sling and given a short-term prescription for Robaxin .  I have also given him a referral to Ortho, should he require surgery for this repeated problem.  Patient's vital signs are stable.  Patient is appropriate for discharge at this time.    Final diagnoses:  Acute pain of right shoulder    ED Discharge Orders          Ordered    methocarbamol  (ROBAXIN ) 500 MG tablet  2 times daily        03/31/24 0810               Torrence Marry RAMAN, PA-C 03/31/24 0818    Neysa Caron PARAS, DO 03/31/24 1035  "
